# Patient Record
Sex: Female | Born: 1950 | Race: White | Hispanic: No | Marital: Married | Smoking: Former smoker
Health system: Southern US, Community
[De-identification: ages and names within clinical notes are randomized; demographics above are authoritative.]

## PROBLEM LIST (undated history)

## (undated) DIAGNOSIS — F410 Panic disorder [episodic paroxysmal anxiety] without agoraphobia: Secondary | ICD-10-CM

## (undated) DIAGNOSIS — K219 Gastro-esophageal reflux disease without esophagitis: Secondary | ICD-10-CM

## (undated) DIAGNOSIS — E785 Hyperlipidemia, unspecified: Secondary | ICD-10-CM

## (undated) DIAGNOSIS — K200 Eosinophilic esophagitis: Secondary | ICD-10-CM

## (undated) DIAGNOSIS — Z8619 Personal history of other infectious and parasitic diseases: Secondary | ICD-10-CM

## (undated) DIAGNOSIS — R32 Unspecified urinary incontinence: Secondary | ICD-10-CM

## (undated) DIAGNOSIS — R030 Elevated blood-pressure reading, without diagnosis of hypertension: Secondary | ICD-10-CM

## (undated) DIAGNOSIS — M5030 Other cervical disc degeneration, unspecified cervical region: Secondary | ICD-10-CM

## (undated) DIAGNOSIS — M1990 Unspecified osteoarthritis, unspecified site: Secondary | ICD-10-CM

## (undated) DIAGNOSIS — M8580 Other specified disorders of bone density and structure, unspecified site: Secondary | ICD-10-CM

## (undated) DIAGNOSIS — K5790 Diverticulosis of intestine, part unspecified, without perforation or abscess without bleeding: Secondary | ICD-10-CM

## (undated) DIAGNOSIS — K589 Irritable bowel syndrome without diarrhea: Secondary | ICD-10-CM

## (undated) DIAGNOSIS — M5416 Radiculopathy, lumbar region: Secondary | ICD-10-CM

## (undated) DIAGNOSIS — Z87440 Personal history of urinary (tract) infections: Secondary | ICD-10-CM

## (undated) DIAGNOSIS — N819 Female genital prolapse, unspecified: Secondary | ICD-10-CM

## (undated) DIAGNOSIS — N3010 Interstitial cystitis (chronic) without hematuria: Secondary | ICD-10-CM

## (undated) DIAGNOSIS — N200 Calculus of kidney: Secondary | ICD-10-CM

## (undated) HISTORY — DX: Calculus of kidney: N20.0

## (undated) HISTORY — DX: Personal history of other infectious and parasitic diseases: Z86.19

## (undated) HISTORY — PX: BREAST BIOPSY: SHX20

## (undated) HISTORY — DX: Personal history of urinary (tract) infections: Z87.440

## (undated) HISTORY — DX: Hyperlipidemia, unspecified: E78.5

## (undated) HISTORY — DX: Interstitial cystitis (chronic) without hematuria: N30.10

## (undated) HISTORY — DX: Unspecified urinary incontinence: R32

## (undated) HISTORY — DX: Elevated blood-pressure reading, without diagnosis of hypertension: R03.0

## (undated) HISTORY — DX: Irritable bowel syndrome, unspecified: K58.9

## (undated) HISTORY — DX: Gastro-esophageal reflux disease without esophagitis: K21.9

## (undated) HISTORY — DX: Panic disorder (episodic paroxysmal anxiety): F41.0

## (undated) HISTORY — PX: TONSILLECTOMY: SUR1361

## (undated) HISTORY — DX: Female genital prolapse, unspecified: N81.9

## (undated) HISTORY — DX: Radiculopathy, lumbar region: M54.16

## (undated) HISTORY — DX: Diverticulosis of intestine, part unspecified, without perforation or abscess without bleeding: K57.90

## (undated) HISTORY — DX: Other specified disorders of bone density and structure, unspecified site: M85.80

## (undated) HISTORY — DX: Other cervical disc degeneration, unspecified cervical region: M50.30

## (undated) HISTORY — DX: Eosinophilic esophagitis: K20.0

## (undated) HISTORY — DX: Unspecified osteoarthritis, unspecified site: M19.90

---
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
## 1992-04-24 HISTORY — PX: KNEE ARTHROSCOPY: SUR90

## 1992-04-24 HISTORY — PX: ABDOMINAL HYSTERECTOMY: SHX81

## 1994-04-24 HISTORY — PX: BILATERAL OOPHORECTOMY: SHX1221

## 2007-04-25 HISTORY — PX: OTHER SURGICAL HISTORY: SHX169

## 2008-04-24 HISTORY — PX: ANTERIOR AND POSTERIOR VAGINAL REPAIR: SUR5

## 2008-04-24 HISTORY — PX: RECTOCELE REPAIR: SHX761

## 2009-09-22 HISTORY — PX: CARDIOVASCULAR STRESS TEST: SHX262

## 2011-04-25 DIAGNOSIS — M5416 Radiculopathy, lumbar region: Secondary | ICD-10-CM

## 2011-04-25 DIAGNOSIS — K200 Eosinophilic esophagitis: Secondary | ICD-10-CM

## 2011-04-25 HISTORY — PX: ESOPHAGOGASTRODUODENOSCOPY: SHX1529

## 2011-04-25 HISTORY — DX: Eosinophilic esophagitis: K20.0

## 2011-04-25 HISTORY — DX: Radiculopathy, lumbar region: M54.16

## 2011-09-25 LAB — CBC
HEMOGLOBIN: 12.1 g/dL
PLATELET COUNT: 263
WBC: 7.3

## 2012-03-24 HISTORY — PX: COLONOSCOPY: SHX174

## 2012-03-27 LAB — HM COLONOSCOPY

## 2013-01-10 LAB — LIPID PANEL
Cholesterol: 236 mg/dL — AB (ref 0–200)
HDL: 82 mg/dL — AB (ref 35–70)
LDL CALC: 118
Triglycerides: 178

## 2013-01-10 LAB — COMPREHENSIVE METABOLIC PANEL
ALT: 30 U/L (ref 7–35)
AST: 18 U/L
Alkaline Phosphatase: 74 U/L
BILIRUBIN TOTAL: 0.4 mg/dL
CREATININE: 0.47
GLUCOSE: 101
POTASSIUM: 4.5 mmol/L
Sodium: 138 mmol/L (ref 137–147)

## 2013-01-10 LAB — CBC
HGB: 12.6 g/dL
WBC: 6.4
platelet count: 267

## 2013-01-10 LAB — TSH: TSH: 1.24

## 2013-02-12 ENCOUNTER — Emergency Department: Payer: Self-pay | Admitting: Emergency Medicine

## 2013-02-12 ENCOUNTER — Emergency Department: Payer: Self-pay

## 2013-02-12 LAB — COMPREHENSIVE METABOLIC PANEL
Alkaline Phosphatase: 132 U/L (ref 50–136)
Bilirubin,Total: 0.3 mg/dL (ref 0.2–1.0)
Chloride: 104 mmol/L (ref 98–107)
Co2: 26 mmol/L (ref 21–32)
EGFR (African American): >60
EGFR (Non-African Amer.): >60
Potassium: 3.8 mmol/L (ref 3.5–5.1)
SGOT(AST): 39 U/L — ABNORMAL HIGH (ref 15–37)
Sodium: 136 mmol/L (ref 136–145)
Total Protein: 7.4 g/dL (ref 6.4–8.2)

## 2013-02-12 LAB — URINALYSIS, COMPLETE
Bilirubin,UR: NEGATIVE
Hyaline Cast: 2
Ketone: NEGATIVE
Nitrite: NEGATIVE
Protein: NEGATIVE
RBC,UR: 3 /HPF (ref 0–5)
Specific Gravity: 1.003 (ref 1.003–1.030)
Squamous Epithelial: 1
WBC UR: 26 /HPF (ref 0–5)

## 2013-02-12 LAB — APTT: Activated PTT: 30.9 secs (ref 23.6–35.9)

## 2013-02-12 LAB — CBC
HGB: 12.2 g/dL (ref 12.0–16.0)
MCH: 31.2 pg (ref 26.0–34.0)
MCV: 90 fL (ref 80–100)
Platelet: 251 10*3/uL (ref 150–440)
RDW: 13.1 % (ref 11.5–14.5)
WBC: 12 10*3/uL — ABNORMAL HIGH (ref 3.6–11.0)

## 2013-02-12 LAB — PROTIME-INR
INR: 0.9
Prothrombin Time: 12.6 secs (ref 11.5–14.7)

## 2013-02-13 ENCOUNTER — Emergency Department: Payer: Self-pay | Admitting: Emergency Medicine

## 2013-05-14 ENCOUNTER — Encounter: Payer: Self-pay | Admitting: Family Medicine

## 2013-05-14 ENCOUNTER — Ambulatory Visit (INDEPENDENT_AMBULATORY_CARE_PROVIDER_SITE_OTHER): Payer: 59 | Admitting: Family Medicine

## 2013-05-14 VITALS — BP 128/82 | HR 64 | Temp 98.0°F | Ht 63.0 in | Wt 155.8 lb

## 2013-05-14 DIAGNOSIS — M899 Disorder of bone, unspecified: Secondary | ICD-10-CM

## 2013-05-14 DIAGNOSIS — M1990 Unspecified osteoarthritis, unspecified site: Secondary | ICD-10-CM | POA: Insufficient documentation

## 2013-05-14 DIAGNOSIS — M949 Disorder of cartilage, unspecified: Secondary | ICD-10-CM

## 2013-05-14 DIAGNOSIS — M8580 Other specified disorders of bone density and structure, unspecified site: Secondary | ICD-10-CM | POA: Insufficient documentation

## 2013-05-14 DIAGNOSIS — M129 Arthropathy, unspecified: Secondary | ICD-10-CM

## 2013-05-14 DIAGNOSIS — E785 Hyperlipidemia, unspecified: Secondary | ICD-10-CM | POA: Insufficient documentation

## 2013-05-14 DIAGNOSIS — N3010 Interstitial cystitis (chronic) without hematuria: Secondary | ICD-10-CM | POA: Insufficient documentation

## 2013-05-14 DIAGNOSIS — M546 Pain in thoracic spine: Secondary | ICD-10-CM

## 2013-05-14 DIAGNOSIS — F410 Panic disorder [episodic paroxysmal anxiety] without agoraphobia: Secondary | ICD-10-CM | POA: Insufficient documentation

## 2013-05-14 DIAGNOSIS — R32 Unspecified urinary incontinence: Secondary | ICD-10-CM | POA: Insufficient documentation

## 2013-05-14 LAB — POCT URINALYSIS DIPSTICK
Bilirubin, UA: NEGATIVE
Glucose, UA: NEGATIVE
Ketones, UA: NEGATIVE
LEUKOCYTES UA: NEGATIVE
NITRITE UA: NEGATIVE
PROTEIN UA: NEGATIVE
RBC UA: NEGATIVE
Spec Grav, UA: <=1.005
Urobilinogen, UA: 0.2
pH, UA: 6.5

## 2013-05-14 MED ORDER — ALPRAZOLAM 0.5 MG PO TABS: 0.5000 mg | ORAL_TABLET | Freq: Two times a day (BID) | ORAL | Status: DC | PRN

## 2013-05-14 MED ORDER — ESTRADIOL 2 MG PO TABS: 2.0000 mg | ORAL_TABLET | Freq: Every day | ORAL | Status: DC

## 2013-05-14 NOTE — Progress Notes (Signed)
Pre-visit discussion using our clinic review tool. No additional management support is needed unless otherwise documented below in the visit note.  

## 2013-05-14 NOTE — Patient Instructions (Signed)
I wonder about rhomboid strain - start stretching exercises provided today.  Look into massage. Ice/heat whichever soothes back better. Urine checked today. Return in 3-4 months for physical, prior fasting for blood work. Good to see you today, call us with questions.

## 2013-05-14 NOTE — Progress Notes (Signed)
Subjective:    Patient ID: Jacqueline Wolf, female    DOB: 19-Jan-1951, 63 y.o.   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  Panic attacks with anxiety - rare xanax use - requests refill today.  Overall well controlled.  Interstitial cystitis - tends to recur every February.  Noticing cloudy urine and odor over last few weeks.  Denies dysuria.  Did have hematuria 01/2013 - dx with bladder and kidney infection.  Would like urine checked today.  Has established with Dr. Lonna CobbStoioff.  H/o kidney stones as well.  Having some R shoulderblade pain over last 4 months.  Has not improved with chiropractor.  This may have started after lifting heavy client into wheelchair.  Preventative: Last CPE 12/2012 - new insurance however Well woman - s/p hysterectomy and oophorectomy Colon cancer screening - 2014 diverticulosis o/w normal [facility] - 12/2012 Birads 2. Dexa - osteopenia Flu shot 2014 Tetanus - 2012 Shingles - will discuss at CPE  Lives with husband and dog Occupation: Transition CNA Edu: HS Activity: pilates, walking  Medications and allergies reviewed and updated in chart.  Past histories reviewed and updated if relevant as below. Patient Active Problem List   Diagnosis Date Noted  . Panic attacks   . Interstitial cystitis   . Arthritis   . Osteopenia   . HLD (hyperlipidemia)   . Urine incontinence    Past Medical History  Diagnosis Date  . Panic attacks 1980s    rare use of xanax  . Interstitial cystitis     did not side effects of elmicon  . Arthritis   . Osteopenia     cal/vit D daily  . Elevated blood pressure reading without diagnosis of hypertension   . HLD (hyperlipidemia)   . Kidney stones 01/2013?  Marland Kitchen. Urine incontinence   . Hx: UTI (urinary tract infection)   . History of hepatitis B 1968  . Eosinophilic esophagitis 2013    on PPI - beef allergy   Past Surgical History    Procedure Laterality Date  . Breast biopsy Bilateral [phone],1991    all benign  . Tonsillectomy  child  . Abdominal hysterectomy  1994    ovaries remain  . Rectal prolapse repair  2010  . Vagina surgery  2010    mesh  . Knee arthroscopy Right 1994  . Bilateral oophorectomy Bilateral 1996    ovaries removed - cysts  . Esophagogastroduodenoscopy  2013    eosinophilic esophagitis  . Colonoscopy  2014    diveticulosis o/w normal per patient   History  Substance Use Topics  . Smoking status: Former Smoker    Quit date: 02/23/2008  . Smokeless tobacco: Never Used  . Alcohol Use: Yes     Comment: occasional alcohol   Family History  Problem Relation Age of Onset  . Diabetes Maternal Grandmother     and great aunt  . Diabetes Maternal Grandfather   . Cancer Brother 60    liver (EtOH)  . Cancer Mother     bladder  . CAD Maternal Grandfather     MI  . Stroke Neg Hx   . Alcohol abuse Brother     and sisters   Allergies  Allergen Reactions  . Beef-Derived Products Other (See Comments)    Eosinophilic esophagitis  . Codeine Hives  . Donnatal [Pb-Hyoscy-Atropine-Scopolamine] Nausea And Vomiting  . Penicillins Hives   No  current outpatient prescriptions on file prior to visit.   No current facility-administered medications on file prior to visit.     Review of Systems Per HPI    Objective:   Physical Exam  Nursing note and vitals reviewed. Constitutional: She is oriented to person, place, and time. She appears well-developed and well-nourished. No distress.  HENT:  Head: Normocephalic and atraumatic.  Right Ear: External ear normal.  Left Ear: External ear normal.  Nose: Nose normal.  Mouth/Throat: Oropharynx is clear and moist. No oropharyngeal exudate.  Eyes: Conjunctivae and EOM are normal. Pupils are equal, round, and reactive to light. No scleral icterus.  Neck: Normal range of motion. Neck supple. Carotid bruit is not present. No thyromegaly present.   Cardiovascular: Normal rate, regular rhythm, normal heart sounds and intact distal pulses.   No murmur heard. Pulses:      Radial pulses are 2+ on the right side, and 2+ on the left side.  Pulmonary/Chest: Effort normal and breath sounds normal. No respiratory distress. She has no wheezes. She has no rales.  Abdominal: Soft. Normal appearance and bowel sounds are normal. She exhibits no distension and no mass. There is no hepatosplenomegaly. There is no tenderness. There is no rebound, no guarding and no CVA tenderness.  Musculoskeletal: Normal range of motion. She exhibits no edema (tr).  Tender to palpation at R rhomboids No midline spine tenderness  Lymphadenopathy:    She has no cervical adenopathy.  Neurological: She is alert and oriented to person, place, and time.  CN grossly intact, station and gait intact  Skin: Skin is warm and dry. No rash noted.  Psychiatric: She has a normal mood and affect. Her behavior is normal. Judgment and thought content normal.       Assessment & Plan:

## 2013-05-15 ENCOUNTER — Encounter: Payer: Self-pay | Admitting: Family Medicine

## 2013-05-15 DIAGNOSIS — M546 Pain in thoracic spine: Secondary | ICD-10-CM | POA: Insufficient documentation

## 2013-05-15 NOTE — Assessment & Plan Note (Signed)
Will review prior records for latest dexa. Pt currently on calcium/vit D

## 2013-05-15 NOTE — Assessment & Plan Note (Signed)
Anticipate R rhomboid strain given location of pain - provided with stretching exercises today.

## 2013-05-15 NOTE — Assessment & Plan Note (Signed)
Continue alprazolam prn

## 2013-05-15 NOTE — Assessment & Plan Note (Addendum)
UA today negative for infection. Discussed NSAID use.

## 2013-05-15 NOTE — Assessment & Plan Note (Signed)
Off meds - will continue to monitor.

## 2013-05-19 ENCOUNTER — Encounter: Payer: Self-pay | Admitting: Family Medicine

## 2013-05-28 ENCOUNTER — Encounter: Payer: Self-pay | Admitting: *Deleted

## 2013-07-02 ENCOUNTER — Telehealth: Payer: Self-pay | Admitting: Family Medicine

## 2013-07-02 NOTE — Telephone Encounter (Signed)
plz notify I received pt and husband's records from [facility]

## 2013-07-02 NOTE — Telephone Encounter (Signed)
Patient notified

## 2013-07-10 ENCOUNTER — Telehealth: Payer: Self-pay | Admitting: Family Medicine

## 2013-07-10 NOTE — Telephone Encounter (Signed)
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  Best number to call pt is [phone] prior to faxing form to [phone] / lt

## 2013-07-10 NOTE — Telephone Encounter (Signed)
Spoke with patient. She said rhomboid strain is better and she is aware of back health/safety and will use this at work. Letter faxed to patient as requested.

## 2013-07-10 NOTE — Telephone Encounter (Signed)
Letter written and placed in Jacqueline Wolf's box. plz verify with patient her rhomboid strain is better as she may need to work with clients in her job functions and this may entail heavy lifting.

## 2013-09-07 ENCOUNTER — Encounter: Payer: Self-pay | Admitting: Family Medicine

## 2013-09-11 ENCOUNTER — Encounter: Payer: Self-pay | Admitting: Family Medicine

## 2013-09-11 ENCOUNTER — Telehealth: Payer: Self-pay

## 2013-09-11 ENCOUNTER — Ambulatory Visit (INDEPENDENT_AMBULATORY_CARE_PROVIDER_SITE_OTHER): Payer: 59 | Admitting: Family Medicine

## 2013-09-11 VITALS — BP 144/84 | HR 88 | Temp 97.9°F | Wt 153.5 lb

## 2013-09-11 DIAGNOSIS — K200 Eosinophilic esophagitis: Secondary | ICD-10-CM

## 2013-09-11 DIAGNOSIS — M546 Pain in thoracic spine: Secondary | ICD-10-CM

## 2013-09-11 DIAGNOSIS — E8940 Asymptomatic postprocedural ovarian failure: Secondary | ICD-10-CM

## 2013-09-11 DIAGNOSIS — Z79890 Hormone replacement therapy (postmenopausal): Secondary | ICD-10-CM | POA: Insufficient documentation

## 2013-09-11 MED ORDER — METHOCARBAMOL 500 MG PO TABS: 500.0000 mg | ORAL_TABLET | Freq: Three times a day (TID) | ORAL | Status: DC | PRN

## 2013-09-11 NOTE — Progress Notes (Signed)
Pre visit review using our clinic review tool, if applicable. No additional management support is needed unless otherwise documented below in the visit note. 

## 2013-09-11 NOTE — Telephone Encounter (Signed)
Message left advising patient.  

## 2013-09-11 NOTE — Telephone Encounter (Signed)
plz notify robaxin was sent in for pt to try.

## 2013-09-11 NOTE — Assessment & Plan Note (Signed)
Has been on HRT for last 20 yrs.  Discussed slow taper off-  Will start estrogen 1mg  daily.

## 2013-09-11 NOTE — Progress Notes (Signed)
BP 144/84  Pulse 88  Temp(Src) 97.9 F (36.6 C) (Oral)  Wt 153 lb 8 oz (69.627 kg)   CC: 4 mo f/u visit  Subjective:    Patient ID: Jacqueline Wolf, female    DOB: 1951-01-06, 63 y.o.   MRN: [id number]  HPI: Jacqueline MarchKaren Hendley is a 63 y.o. female presenting on 09/11/2013 for Follow-up   I asked her to return for CPE but pt scheduled f/u visit today. Not due for CPE until 12/2012. Pt states this f/u was for shoulder/back ache.  Saw chirpractor, may have worsened (now with R neck pain as well).  Initially thought rhomboid strain and treated with exercises which improved sxs.  Over last few weeks - sxs recurred - after she moved from apt to new house. Did heavy lifting along with packing/unpacking.    Endorses R rhomboid pain along with R neck pain with some radiation down right arm.  Notices pain mainly in am. Denies weakness of arms. Self treats with ibuprofen 400mg  bid prn. Tolerates NSAID well.   BP Readings from Last 3 Encounters:  09/11/13 144/84  05/14/13 128/82  wonders if elevated bp due to pain.  Tends to run low 110 systolic.  Relevant past medical, surgical, family and social history reviewed and updated as indicated.  Allergies and medications reviewed and updated. Current Outpatient Prescriptions on File Prior to Visit  Medication Sig  . ALPRAZolam (XANAX) 0.5 MG tablet Take 1 tablet (0.5 mg total) by mouth 2 (two) times daily as needed for anxiety.  . Calcium Carb-Cholecalciferol (CALCIUM-VITAMIN D3) 600-500 MG-UNIT CAPS Take 2 capsules by mouth daily.  Marland Kitchen. docusate sodium (COLACE) 100 MG capsule Take 100 mg by mouth daily.  . Multiple Vitamin (MULTIVITAMIN) tablet Take 1 tablet by mouth daily.  Marland Kitchen. omeprazole (PRILOSEC) 20 MG capsule Take 20 mg by mouth daily.  . solifenacin (VESICARE) 5 MG tablet Take 5 mg by mouth daily.  . vitamin E 400 UNIT capsule Take 400 Units by mouth daily.   No current facility-administered medications on file prior to visit.    Review of  Systems Per HPI unless specifically indicated above    Objective:    BP 144/84  Pulse 88  Temp(Src) 97.9 F (36.6 C) (Oral)  Wt 153 lb 8 oz (69.627 kg)  Physical Exam  Nursing note and vitals reviewed. Constitutional: She appears well-developed and well-nourished. No distress.  HENT:  Mouth/Throat: Oropharynx is clear and moist. No oropharyngeal exudate.  Neck: Normal range of motion. Neck supple.  Cardiovascular: Normal rate, regular rhythm, normal heart sounds and intact distal pulses.   No murmur heard. Pulmonary/Chest: Effort normal and breath sounds normal. No respiratory distress. She has no wheezes. She has no rales.  Musculoskeletal:  FROM at neck and shoulders without pain. No midline spine tenderness at cervical or thoracic spine. Tender to palpation R lateral neck as well as R rhomboids.   Results for orders placed in visit on 05/28/13  CBC      Result Value Ref Range   WBC 6.4     HGB 12.6     platelet count 267    COMPREHENSIVE METABOLIC PANEL      Result Value Ref Range   Total Bilirubin 0.4     Alkaline Phosphatase 74     AST 18     Glucose 101     Creat 0.47     Sodium 138  137 - 147 mmol/L   Potassium 4.5     ALT 30  7 - 35 U/L  LIPID PANEL      Result Value Ref Range   Triglycerides 178     Cholesterol 236 (*) 0 - 200 mg/dL   HDL 82 (*) 35 - 70 mg/dL   LDL (calc) 161118    TSH      Result Value Ref Range   TSH 1.24        Assessment & Plan:   Problem List Items Addressed This Visit   Thoracic back pain - Primary     Still consistent with rhomboid strain along with cervical neck strain after recent increase in heavy lifting with move. rec continue ibuprofen prn as well as muscle relaxant. rec continue home exercises provided last visit which helped. Update if sxs persist or deteriorate. Consider PT if not improving.    Surgical menopause on hormone replacement therapy     Has been on HRT for last 20 yrs.  Discussed slow taper off-  Will  start estrogen 1mg  daily.    Eosinophilic esophagitis     Continue ppi as she is on nsaids intermittently.        Follow up plan: Return in about 6 months (around 03/14/2014), or as needed, for annual exam, prior fasting for blood work.

## 2013-09-11 NOTE — Telephone Encounter (Signed)
Pt was seen earlier today and was to cb with name of muscle relaxant that was too strong for pt; cyclobenzaprine 10 mg. Pt request different muscle relaxant to [facility].

## 2013-09-11 NOTE — Patient Instructions (Addendum)
Let's decrease estrogen to 1/2 tablet daily (1mg ). I think this back pain is still musculoskeletal. Call me with muscle relaxant you have at home and we will try a different one as this one is causing you too sleepy. Good to see you today, call us with questions. Return 6 months for physical.

## 2013-09-11 NOTE — Assessment & Plan Note (Signed)
Continue ppi as she is on nsaids intermittently.

## 2013-09-11 NOTE — Assessment & Plan Note (Signed)
Still consistent with rhomboid strain along with cervical neck strain after recent increase in heavy lifting with move. rec continue ibuprofen prn as well as muscle relaxant. rec continue home exercises provided last visit which helped. Update if sxs persist or deteriorate. Consider PT if not improving.

## 2013-09-12 ENCOUNTER — Encounter: Payer: Self-pay | Admitting: *Deleted

## 2013-09-26 ENCOUNTER — Other Ambulatory Visit: Payer: Self-pay | Admitting: Family Medicine

## 2013-09-26 NOTE — Telephone Encounter (Signed)
Ok to refill 

## 2013-10-18 ENCOUNTER — Other Ambulatory Visit: Payer: Self-pay | Admitting: Family Medicine

## 2013-12-24 ENCOUNTER — Other Ambulatory Visit: Payer: Self-pay | Admitting: Family Medicine

## 2014-02-05 ENCOUNTER — Other Ambulatory Visit: Payer: Self-pay | Admitting: Family Medicine

## 2014-02-16 ENCOUNTER — Other Ambulatory Visit: Payer: Self-pay | Admitting: Family Medicine

## 2014-02-16 ENCOUNTER — Other Ambulatory Visit (INDEPENDENT_AMBULATORY_CARE_PROVIDER_SITE_OTHER): Payer: 59

## 2014-02-16 DIAGNOSIS — E785 Hyperlipidemia, unspecified: Secondary | ICD-10-CM

## 2014-02-16 LAB — COMPREHENSIVE METABOLIC PANEL
ALT: 23 U/L (ref 0–35)
AST: 25 U/L (ref 0–37)
Albumin: 3.6 g/dL (ref 3.5–5.2)
Alkaline Phosphatase: 51 U/L (ref 39–117)
BILIRUBIN TOTAL: 0.6 mg/dL (ref 0.2–1.2)
BUN: 10 mg/dL (ref 6–23)
CALCIUM: 9.2 mg/dL (ref 8.4–10.5)
CHLORIDE: 103 meq/L (ref 96–112)
CO2: 26 mEq/L (ref 19–32)
CREATININE: 0.6 mg/dL (ref 0.4–1.2)
GFR: 99.56 mL/min (ref >60.00)
GLUCOSE: 97 mg/dL (ref 70–99)
Potassium: 4.5 mEq/L (ref 3.5–5.1)
SODIUM: 139 meq/L (ref 135–145)
TOTAL PROTEIN: 7.2 g/dL (ref 6.0–8.3)

## 2014-02-16 LAB — TSH: TSH: 1.5 u[IU]/mL (ref 0.35–4.50)

## 2014-02-16 LAB — LIPID PANEL
Cholesterol: 255 mg/dL — ABNORMAL HIGH (ref 0–200)
HDL: 71.3 mg/dL (ref >39.00)
NONHDL: 183.7
TRIGLYCERIDES: 234 mg/dL — AB (ref 0.0–149.0)
Total CHOL/HDL Ratio: 4
VLDL: 46.8 mg/dL — ABNORMAL HIGH (ref 0.0–40.0)

## 2014-02-16 LAB — LDL CHOLESTEROL, DIRECT: LDL DIRECT: 149 mg/dL

## 2014-02-23 ENCOUNTER — Ambulatory Visit (INDEPENDENT_AMBULATORY_CARE_PROVIDER_SITE_OTHER): Payer: 59 | Admitting: Family Medicine

## 2014-02-23 ENCOUNTER — Ambulatory Visit (INDEPENDENT_AMBULATORY_CARE_PROVIDER_SITE_OTHER)
Admission: RE | Admit: 2014-02-23 | Discharge: 2014-02-23 | Disposition: A | Discharge status: Home / Self Care | Payer: 59 | Source: Ambulatory Visit | Attending: Family Medicine | Admitting: Family Medicine

## 2014-02-23 ENCOUNTER — Encounter: Payer: Self-pay | Admitting: Family Medicine

## 2014-02-23 VITALS — BP 124/82 | HR 76 | Temp 97.7°F | Ht 63.0 in | Wt 156.2 lb

## 2014-02-23 DIAGNOSIS — Z0000 Encounter for general adult medical examination without abnormal findings: Secondary | ICD-10-CM

## 2014-02-23 DIAGNOSIS — Z23 Encounter for immunization: Secondary | ICD-10-CM

## 2014-02-23 DIAGNOSIS — Z79890 Hormone replacement therapy (postmenopausal): Secondary | ICD-10-CM

## 2014-02-23 DIAGNOSIS — E8940 Asymptomatic postprocedural ovarian failure: Secondary | ICD-10-CM

## 2014-02-23 DIAGNOSIS — M546 Pain in thoracic spine: Secondary | ICD-10-CM

## 2014-02-23 DIAGNOSIS — M8580 Other specified disorders of bone density and structure, unspecified site: Secondary | ICD-10-CM

## 2014-02-23 DIAGNOSIS — F410 Panic disorder [episodic paroxysmal anxiety] without agoraphobia: Secondary | ICD-10-CM

## 2014-02-23 DIAGNOSIS — Z1239 Encounter for other screening for malignant neoplasm of breast: Secondary | ICD-10-CM

## 2014-02-23 DIAGNOSIS — E785 Hyperlipidemia, unspecified: Secondary | ICD-10-CM

## 2014-02-23 MED ORDER — ALPRAZOLAM 0.5 MG PO TABS
0.5000 mg | ORAL_TABLET | Freq: Two times a day (BID) | ORAL | Status: DC | PRN
Start: 2014-02-23 — End: 2014-07-22

## 2014-02-23 NOTE — Addendum Note (Signed)
Addended by: Josph MachoANCE, Jahlani Lorentz A on: 02/23/2014 10:22 AM   Modules accepted: Orders

## 2014-02-23 NOTE — Patient Instructions (Addendum)
Pass by Marion's office to schedule mammogram. zostavax today. Cholesterol was too high - work on low cholesterol diet, handout provided today. Good to see you today, call us with questions. Return as needed or in 6 months to recheck cholesterol levels (prior for fasting labs). For back - xray today and we will refer you to PT.

## 2014-02-23 NOTE — Assessment & Plan Note (Signed)
Refill xanax - rare use. Last filled 04/2013.

## 2014-02-23 NOTE — Assessment & Plan Note (Signed)
Previously and again today consistent with rhomboid strain. However given duration of pain, will obtain thoracic spine films and refer to outpatient PT. If not improved, would consider MRI for further evaluation.

## 2014-02-23 NOTE — Assessment & Plan Note (Signed)
Preventative protocols reviewed and updated unless pt declined. Discussed healthy diet and lifestyle.  

## 2014-02-23 NOTE — Addendum Note (Signed)
Addended by: Josph MachoANCE, Hensley Treat A on: 02/23/2014 10:06 AM   Modules accepted: Orders

## 2014-02-23 NOTE — Progress Notes (Signed)
BP 124/82 mmHg  Pulse 76  Temp(Src) 97.7 F (36.5 C) (Tympanic)  Ht 5\' 3"  (1.6 m)  Wt 156 lb 4 oz (70.875 kg)  BMI 27.69 kg/m2   CC: CPE  Subjective:    Patient ID: Jacqueline Wolf, female    DOB: 1950-09-23, 63 y.o.   MRN: [id number]  HPI: Jacqueline Wolf is a 63 y.o. female presenting on 02/23/2014 for Annual Exam   Tried to back off estrogen - did not tolerate - worsening mood Continued thoracic back pain. Ongoing pain for the last year. Previously thought rhomboid strain related.  Occasional LLQ pain ongoing for last few weeks. No fevers, but occasional BM Changes. S/p normal CT scan and cystoscopy and colonoscopy. Known diverticulosis and IBS, previously librax helped but caused pruritis.  Has f/u appt with Stoioff nov 2015. Rare xanax use - requests refill today. Last filled 04/2013.  Preventative: Well woman - s/p hysterectomy and oophorectomy Colon cancer screening - 2014 diverticulosis o/w normal per patient [facility] mammo - 12/2012 Birads 2. 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  Allergies and medications reviewed and updated. Current Outpatient Prescriptions on File Prior to Visit  Medication Sig  . ALPRAZolam (XANAX) 0.5 MG tablet Take 1 tablet (0.5 mg total) by mouth 2 (two) times daily as needed for anxiety.  . Calcium Carb-Cholecalciferol (CALCIUM-VITAMIN D3) 600-500 MG-UNIT CAPS Take 2 capsules by mouth daily.  Marland Kitchen docusate sodium (COLACE) 100 MG capsule Take 100 mg by mouth daily.  Marland Kitchen estradiol (ESTRACE) 2 MG tablet Take 2 mg by mouth daily.   . methocarbamol (ROBAXIN) 500 MG tablet TAKE 1 TABLET BY MOUTH EVERY 8 HOURS AS NEEDED FOR MUSCLE SPASMS  . Multiple Vitamin (MULTIVITAMIN) tablet Take 1 tablet  by mouth daily.  Marland Kitchen omeprazole (PRILOSEC) 20 MG capsule TAKE 1 CAPSULE BY MOUTH ONCE DAILY.  Marland Kitchen solifenacin (VESICARE) 5 MG tablet Take 5 mg by mouth daily.  . vitamin E 400 UNIT capsule Take 400 Units by mouth daily.   No current facility-administered medications on file prior to visit.    Review of Systems  Constitutional: Negative for fever, chills, activity change, appetite change, fatigue and unexpected weight change.  HENT: Negative for hearing loss.   Eyes: Negative for visual disturbance.  Respiratory: Negative for cough, chest tightness, shortness of breath and wheezing.   Cardiovascular: Negative for chest pain, palpitations and leg swelling.  Gastrointestinal: Positive for abdominal pain (see hpi). Negative for nausea, vomiting, diarrhea, constipation, blood in stool and abdominal distention.  Genitourinary: Negative for hematuria and difficulty urinating.  Musculoskeletal: Positive for back pain (thoracic). Negative for myalgias, arthralgias and neck pain.  Skin: Negative for rash.  Neurological: Negative for dizziness, seizures, syncope and headaches.  Hematological: Negative for adenopathy. Does not bruise/bleed easily.  Psychiatric/Behavioral: Negative for dysphoric mood. The patient is not nervous/anxious.    Per HPI unless specifically indicated above    Objective:    BP 124/82 mmHg  Pulse 76  Temp(Src) 97.7 F (36.5 C) (Tympanic)  Ht 5\' 3"  (1.6 m)  Wt 156 lb 4 oz (70.875 kg)  BMI 27.69 kg/m2  Physical Exam  Constitutional: She is oriented to person, place, and time. She appears well-developed and well-nourished. No distress.  HENT:  Head:  Normocephalic and atraumatic.  Right Ear: Hearing, tympanic membrane, external ear and ear canal normal.  Left Ear: Hearing, tympanic membrane, external ear and ear canal normal.  Nose: Nose normal.  Mouth/Throat: Uvula is midline, oropharynx is clear and moist and mucous membranes are normal. No oropharyngeal exudate,  posterior oropharyngeal edema or posterior oropharyngeal erythema.  Eyes: Conjunctivae and EOM are normal. Pupils are equal, round, and reactive to light. No scleral icterus.  Neck: Normal range of motion. Neck supple. No thyromegaly present.  Cardiovascular: Normal rate, regular rhythm, normal heart sounds and intact distal pulses.   No murmur heard. Pulses:      Radial pulses are 2+ on the right side, and 2+ on the left side.  Pulmonary/Chest: Effort normal and breath sounds normal. No respiratory distress. She has no wheezes. She has no rales. Right breast exhibits no inverted nipple, no mass, no nipple discharge, no skin change and no tenderness. Left breast exhibits no inverted nipple, no mass, no nipple discharge, no skin change and no tenderness. Breasts are symmetrical.  Abdominal: Soft. Normal appearance and bowel sounds are normal. She exhibits no distension and no mass. There is no hepatosplenomegaly. There is no tenderness. There is no rigidity, no rebound, no guarding, no CVA tenderness and negative Murphy's sign.  No pain today.  Musculoskeletal: Normal range of motion. She exhibits no edema.  Tender to palpation R rhomboid muscle group  Lymphadenopathy:       Head (right side): No submental, no submandibular, no tonsillar, no preauricular and no posterior auricular adenopathy present.       Head (left side): No submental, no submandibular, no tonsillar, no preauricular and no posterior auricular adenopathy present.    She has no cervical adenopathy.    She has no axillary adenopathy.       Right axillary: No lateral adenopathy present.       Left axillary: No lateral adenopathy present.      Right: No supraclavicular adenopathy present.       Left: No supraclavicular adenopathy present.  Neurological: She is alert and oriented to person, place, and time.  CN grossly intact, station and gait intact  Skin: Skin is warm and dry. No rash noted.  Psychiatric: She has a normal mood  and affect. Her behavior is normal. Judgment and thought content normal.  Nursing note and vitals reviewed.  Results for orders placed or performed in visit on 02/16/14  TSH  Result Value Ref Range   TSH 1.50 0.35 - 4.50 uIU/mL  Lipid panel  Result Value Ref Range   Cholesterol 255 (H) 0 - 200 mg/dL   Triglycerides 045.4234.0 (H) 0.0 - 149.0 mg/dL   HDL 09.8171.30 >19.14>39.00 mg/dL   VLDL 78.246.8 (H) 0.0 - 95.640.0 mg/dL   Total CHOL/HDL Ratio 4    NonHDL 183.70   Comprehensive metabolic panel  Result Value Ref Range   Sodium 139 135 - 145 mEq/L   Potassium 4.5 3.5 - 5.1 mEq/L   Chloride 103 96 - 112 mEq/L   CO2 26 19 - 32 mEq/L   Glucose, Bld 97 70 - 99 mg/dL   BUN 10 6 - 23 mg/dL   Creatinine, Ser 0.6 0.4 - 1.2 mg/dL   Total Bilirubin 0.6 0.2 - 1.2 mg/dL   Alkaline Phosphatase 51 39 - 117 U/L   AST 25 0 - 37 U/L   ALT 23 0 - 35 U/L   Total Protein 7.2 6.0 - 8.3 g/dL   Albumin 3.6 3.5 -  5.2 g/dL   Calcium 9.2 8.4 - 16.110.5 mg/dL   GFR 09.6099.56 >45.40>60.00 mL/min  LDL cholesterol, direct  Result Value Ref Range   Direct LDL 149.0 mg/dL      Assessment & Plan:   Problem List Items Addressed This Visit    Thoracic back pain    Previously and again today consistent with rhomboid strain. However given duration of pain, will obtain thoracic spine films and refer to outpatient PT. If not improved, would consider MRI for further evaluation.    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    Panic attacks    Refill xanax - rare use. Last filled 04/2013.    Osteopenia    Continue cal/vit D    HLD (hyperlipidemia)    Reviewed #s with patient.    Health care maintenance - Primary    Preventative protocols reviewed and updated unless pt declined. Discussed healthy diet and lifestyle.     Other Visit Diagnoses    Breast cancer screening        Relevant Orders        MM DIGITAL SCREENING BILATERAL        Follow up plan: Return in about 6 months (around 08/24/2014), or as needed, for follow up visit.

## 2014-02-23 NOTE — Assessment & Plan Note (Signed)
Did not tolerate taper off HRT.

## 2014-02-23 NOTE — Progress Notes (Signed)
Pre visit review using our clinic review tool, if applicable. No additional management support is needed unless otherwise documented below in the visit note. 

## 2014-02-23 NOTE — Assessment & Plan Note (Signed)
Continue cal/vit D.  

## 2014-02-23 NOTE — Assessment & Plan Note (Signed)
Reviewed #s with patient. 

## 2014-02-24 ENCOUNTER — Telehealth: Payer: Self-pay

## 2014-02-24 NOTE — Telephone Encounter (Signed)
See results note. 

## 2014-02-24 NOTE — Telephone Encounter (Signed)
Pt left v/m; pt anxious to get xray results done on 02/23/14.Please advise.

## 2014-03-04 ENCOUNTER — Other Ambulatory Visit: Payer: Self-pay | Admitting: Family Medicine

## 2014-03-04 NOTE — Telephone Encounter (Signed)
Ok to refill 

## 2014-03-26 ENCOUNTER — Ambulatory Visit: Payer: Self-pay | Admitting: Family Medicine

## 2014-03-26 ENCOUNTER — Encounter: Payer: Self-pay | Admitting: Family Medicine

## 2014-03-26 ENCOUNTER — Encounter: Payer: Self-pay | Admitting: *Deleted

## 2014-03-26 LAB — HM MAMMOGRAPHY: HM Mammogram: NORMAL

## 2014-04-01 ENCOUNTER — Encounter: Payer: Self-pay | Admitting: Family Medicine

## 2014-04-01 ENCOUNTER — Ambulatory Visit (INDEPENDENT_AMBULATORY_CARE_PROVIDER_SITE_OTHER): Payer: 59 | Admitting: Family Medicine

## 2014-04-01 VITALS — BP 128/74 | HR 96 | Temp 98.0°F | Wt 152.5 lb

## 2014-04-01 DIAGNOSIS — M546 Pain in thoracic spine: Secondary | ICD-10-CM

## 2014-04-01 MED ORDER — PREDNISONE 20 MG PO TABS: ORAL_TABLET | ORAL | Status: DC

## 2014-04-01 NOTE — Addendum Note (Signed)
Addended by: Eustaquio BoydenGUTIERREZ, Jay Haskew on: 04/01/2014 01:33 PM   Modules accepted: Level of Service

## 2014-04-01 NOTE — Assessment & Plan Note (Signed)
Chronic longstanding R thoracic back pain (1.5 years) affecting daily routine and limiting ability to increase hours at work.  Failed PT, OTC NSAIDs, 2 muscle relaxants. Pt hesitant for prescription NSAID or stronger pain med 2/2 side effects. Will obtain thoracic MRI to eval for thoracic radiculopathy or herniated disc or other cause of chronic pain. Will treat anticipated inflammation with prednisone taper - discussed steroid precautions with patient. Will also refer to Dr Venetia MaxonStern per pt request for further evaluation of pain. Update if any worsening. Pt agrees with plan.

## 2014-04-01 NOTE — Progress Notes (Signed)
Pre visit review using our clinic review tool, if applicable. No additional management support is needed unless otherwise documented below in the visit note. 

## 2014-04-01 NOTE — Patient Instructions (Signed)
Prednisone course. Pass by Marion's office for MRI and referral to Dr Venetia MaxonStern.

## 2014-04-01 NOTE — Progress Notes (Signed)
BP 128/74 mmHg  Pulse 96  Temp(Src) 98 F (36.7 C) (Oral)  Wt 152 lb 8 oz (69.174 kg)   CC: f/u back pain  Subjective:    Patient ID: Jacqueline Wolf, female    DOB: 05/04/1950, 63 y.o.   MRN: [id number]  HPI: Jacqueline Wolf is a 63 y.o. female presenting on 04/01/2014 for Follow-up   See prior note for details - briefly, pt has suffered from longstanding (1.5 yrs) right sided thoracic back pain previously thought rhomboid strain-related. Some radiation to posterior upper arm/shoulder and neck on right. Denies numbness or weakness. Treated with physical therapy course - with no noted improvement and actually caused R upper arm and neck strain. Frustrated with continued pain. 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   Flexeril too sedating. Robaxin initially helpful but now lost effectiveness. OTC NSAIDs no help. Hesitant for prescription NSAIDs 2/2 GI side effects. Has not taken steroids in the past.  Considering acupuncture.   THORACIC SPINE - 2 VIEW + SWIMMERS COMPARISON: None. FINDINGS: Subtle curvature of the upper thoracic spine convex to the left which may be partly positional in nature. Vertebral body heights and disc spaces are within normal. There is minimal spondylosis of the thoracic spine as well as the visualized cervical spine. Pedicles are intact. There is no compression fracture or subluxation.  IMPRESSION: No acute findings. Mild degenerative changes.  Electronically Signed  By: Elberta Fortisaniel Boyle M.D.  On: 02/23/2014 11:03  Relevant past medical, surgical, family and social history reviewed and updated as indicated. Interim medical history since our last visit reviewed. Allergies and medications reviewed and updated.  Current Outpatient Prescriptions on File Prior to Visit  Medication Sig  . ALPRAZolam (XANAX) 0.5 MG tablet Take 1 tablet (0.5 mg total) by mouth 2 (two) times daily as needed for anxiety.  . Calcium Carb-Cholecalciferol  (CALCIUM-VITAMIN D3) 600-500 MG-UNIT CAPS Take 2 capsules by mouth daily.  Marland Kitchen. docusate sodium (COLACE) 100 MG capsule Take 100 mg by mouth daily.  Marland Kitchen. estradiol (ESTRACE) 2 MG tablet Take 2 mg by mouth daily.   . methocarbamol (ROBAXIN) 500 MG tablet TAKE 1 TABLET BY MOUTH EVERY 8 HOURS AS NEEDED FOR MUSCLE SPASMS  . Multiple Vitamin (MULTIVITAMIN) tablet Take 1 tablet by mouth daily.  Marland Kitchen. omeprazole (PRILOSEC) 20 MG capsule TAKE 1 CAPSULE BY MOUTH ONCE DAILY.  Marland Kitchen. solifenacin (VESICARE) 5 MG tablet Take 5 mg by mouth daily.  . vitamin E 400 UNIT capsule Take 400 Units by mouth daily.   No current facility-administered medications on file prior to visit.   Past Medical History  Diagnosis Date  . Panic attacks 1980s    rare use of xanax  . Interstitial cystitis     did not tolerate side effects of elmicon (urology)  . Arthritis   . Osteopenia     cal/vit D daily  . Elevated blood pressure reading without diagnosis of hypertension   . HLD (hyperlipidemia)   . Kidney stones 01/2013?  Marland Kitchen. Urine incontinence   . Hx: UTI (urinary tract infection)   . History of hepatitis B 1968  . Eosinophilic esophagitis 2013    on PPI - beef allergy  . Prolapse of female pelvic organs     with rectocele and vaginal atrophy, has seen urogyn in past  . IBS (irritable bowel syndrome)   . Esophageal reflux   . DDD (degenerative disc disease), cervical 2010    C5/6 by MRI  . Lumbar radiculopathy 2013  R L4/5 by MRI  . Diverticulosis     by CT and colonoscopy    Past Surgical History  Procedure Laterality Date  . Breast biopsy Bilateral [phone],1981,1991    all benign  . Tonsillectomy  child  . Abdominal hysterectomy  1994    ovaries removed  . Rectocele repair  2010  . Anterior and posterior vaginal repair  2010    with cystocele repair, mesh  . Knee arthroscopy Right 1994  . Bilateral oophorectomy Bilateral 1996    ovaries removed - cysts  . Esophagogastroduodenoscopy  2013    eosinophilic  esophagitis  . Colonoscopy  03/2012    diveticulosis o/w normal per patient  . Cardiovascular stress test  09/2009    WNL EF 65%  . Dexa  2009    WNL, mild osteopenia T -1.0   History  Substance Use Topics  . Smoking status: Former Smoker    Quit date: 02/23/2008  . Smokeless tobacco: Never Used  . Alcohol Use: Yes     Comment: occasional alcohol   Review of Systems Per HPI unless specifically indicated above     Objective:    BP 128/74 mmHg  Pulse 96  Temp(Src) 98 F (36.7 C) (Oral)  Wt 152 lb 8 oz (69.174 kg)  Wt Readings from Last 3 Encounters:  04/01/14 152 lb 8 oz (69.174 kg)  02/23/14 156 lb 4 oz (70.875 kg)  09/11/13 153 lb 8 oz (69.627 kg)    Physical Exam  Constitutional: She is oriented to person, place, and time. She appears well-developed and well-nourished. No distress.  Musculoskeletal: She exhibits no edema.  No midline cervical or thoracic spine pain Tender to palpation right rhomboid area, larger area of local tenderness than prior Some tightness of R rhomboid and R trap mm FROM at neck and shoulders  Neurological: She is alert and oriented to person, place, and time.  Sensation and strength grossly intact  Nursing note and vitals reviewed.      Assessment & Plan:   Problem List Items Addressed This Visit    Thoracic back pain - Primary    Chronic longstanding R thoracic back pain (1.5 years) affecting daily routine and limiting ability to increase hours at work.  Failed PT, OTC NSAIDs, 2 muscle relaxants. Pt hesitant for prescription NSAID or stronger pain med 2/2 side effects. Will obtain thoracic MRI to eval for thoracic radiculopathy or herniated disc or other cause of chronic pain. Will treat anticipated inflammation with prednisone taper - discussed steroid precautions with patient. Will also refer to Dr Venetia MaxonStern per pt request for further evaluation of pain. Update if any worsening. Pt agrees with plan.    Relevant Medications       predniSONE (DELTASONE) tablet   Other Relevant Orders      MR Thoracic Spine Wo Contrast      Ambulatory referral to Neurosurgery       Follow up plan: Return if symptoms worsen or fail to improve.

## 2014-04-07 ENCOUNTER — Ambulatory Visit
Admission: RE | Admit: 2014-04-07 | Discharge: 2014-04-07 | Disposition: A | Discharge status: Home / Self Care | Payer: 59 | Source: Ambulatory Visit | Attending: Family Medicine | Admitting: Family Medicine

## 2014-04-07 DIAGNOSIS — M546 Pain in thoracic spine: Secondary | ICD-10-CM

## 2014-04-13 ENCOUNTER — Other Ambulatory Visit: Payer: Self-pay | Admitting: Family Medicine

## 2014-06-01 ENCOUNTER — Other Ambulatory Visit: Payer: Self-pay | Admitting: Family Medicine

## 2014-06-01 NOTE — Telephone Encounter (Signed)
Ok to refill 

## 2014-07-06 ENCOUNTER — Encounter: Payer: Self-pay | Admitting: Family Medicine

## 2014-07-06 ENCOUNTER — Ambulatory Visit (INDEPENDENT_AMBULATORY_CARE_PROVIDER_SITE_OTHER): Payer: 59 | Admitting: Family Medicine

## 2014-07-06 VITALS — BP 154/98 | HR 92 | Temp 98.0°F | Wt 156.5 lb

## 2014-07-06 DIAGNOSIS — K200 Eosinophilic esophagitis: Secondary | ICD-10-CM

## 2014-07-06 DIAGNOSIS — M546 Pain in thoracic spine: Secondary | ICD-10-CM

## 2014-07-06 DIAGNOSIS — R030 Elevated blood-pressure reading, without diagnosis of hypertension: Secondary | ICD-10-CM | POA: Insufficient documentation

## 2014-07-06 DIAGNOSIS — R1084 Generalized abdominal pain: Secondary | ICD-10-CM | POA: Insufficient documentation

## 2014-07-06 LAB — POCT URINALYSIS DIPSTICK
Bilirubin, UA: NEGATIVE
Glucose, UA: NEGATIVE
Ketones, UA: NEGATIVE
LEUKOCYTES UA: NEGATIVE
NITRITE UA: NEGATIVE
Protein, UA: NEGATIVE
RBC UA: NEGATIVE
Spec Grav, UA: >=1.03
Urobilinogen, UA: 0.2
pH, UA: 6

## 2014-07-06 MED ORDER — OMEPRAZOLE 40 MG PO CPDR: 40.0000 mg | DELAYED_RELEASE_CAPSULE | Freq: Every day | ORAL | Status: DC

## 2014-07-06 NOTE — Progress Notes (Addendum)
BP 180/100 mmHg  Pulse 100  Temp(Src) 98 F (36.7 C) (Oral)  Wt 156 lb 8 oz (70.988 kg)   CC: abd pain, back pain  Subjective:    Patient ID: Jacqueline Wolf, female    DOB: Jun 23, 1950, 64 y.o.   MRN: [id number]  HPI: Jacqueline Wolf is a 64 y.o. female presenting on 07/06/2014 for Abdominal Pain and Back Pain   Not feeling well today. Started with RUQ pain, indigestion with diarrhea all last week. Some chills and constipation as well.   Denies fevers, vomiting, nausea. Patient with IBS, esophageal reflux with eosinophilic esophagitis last EGD [id number]. Reports compliance with omeprazole. Previous librax caused skin crawling sensation.  Persistent thoracic back pain that hasn't improved. Has seen Dr Venetia MaxonStern who obtained neck MRI and rec referral back to PT.  Methocarbamol 500mg  not helpful.  Stopped working since beginning of this year. Has applied for social security. Doesn't feel she is able to work with current back pain.  Husband is to lose his job this year. Stressed with this.  Son about to get our of jail - imprisoned over 23 yrs.   Relevant past medical, surgical, family and social history reviewed and updated as indicated. Interim medical history since our last visit reviewed. Allergies and medications reviewed and updated. Current Outpatient Prescriptions on File Prior to Visit  Medication Sig  . ALPRAZolam (XANAX) 0.5 MG tablet Take 1 tablet (0.5 mg total) by mouth 2 (two) times daily as needed for anxiety.  . Calcium Carb-Cholecalciferol (CALCIUM-VITAMIN D3) 600-500 MG-UNIT CAPS Take 2 capsules by mouth daily.  Marland Kitchen. docusate sodium (COLACE) 100 MG capsule Take 100 mg by mouth daily.  Marland Kitchen. estradiol (ESTRACE) 2 MG tablet Take 2 mg by mouth daily.   . methocarbamol (ROBAXIN) 500 MG tablet TAKE 1 TABLET BY MOUTH EVERY 8 HOURS AS NEEDED FOR MUSCLE SPASMS  . Multiple Vitamin (MULTIVITAMIN) tablet Take 1 tablet by mouth daily.  . solifenacin (VESICARE) 5 MG tablet Take 5 mg by mouth daily.   . vitamin E 400 UNIT capsule Take 400 Units by mouth daily.   No current facility-administered medications on file prior to visit.   Past Medical History  Diagnosis Date  . Panic attacks 1980s    rare use of xanax  . Interstitial cystitis     did not tolerate side effects of elmicon (urology)  . Arthritis   . Osteopenia     cal/vit D daily  . Elevated blood pressure reading without diagnosis of hypertension   . HLD (hyperlipidemia)   . Kidney stones 01/2013?  Marland Kitchen. Urine incontinence   . Hx: UTI (urinary tract infection)   . History of hepatitis B 1968  . Eosinophilic esophagitis 2013    on PPI - beef allergy  . Prolapse of female pelvic organs     with rectocele and vaginal atrophy, has seen urogyn in past  . IBS (irritable bowel syndrome)   . Esophageal reflux   . DDD (degenerative disc disease), cervical 2010    C5/6 by MRI  . Lumbar radiculopathy 2013    R L4/5 by MRI  . Diverticulosis     by CT and colonoscopy   Past Surgical History  Procedure Laterality Date  . Breast biopsy Bilateral [phone],1981,1991    all benign  . Tonsillectomy  child  . Abdominal hysterectomy  1994    ovaries removed  . Rectocele repair  2010  . Anterior and posterior vaginal repair  2010    with cystocele repair,  mesh  . Knee arthroscopy Right 1994  . Bilateral oophorectomy Bilateral 1996    ovaries removed - cysts  . Esophagogastroduodenoscopy  2013    eosinophilic esophagitis  . Colonoscopy  03/2012    diveticulosis o/w normal per patient  . Cardiovascular stress test  09/2009    WNL EF 65%  . Dexa  2009    WNL, mild osteopenia T -1.0   Review of Systems Per HPI unless specifically indicated above     Objective:    BP 180/100 mmHg  Pulse 100  Temp(Src) 98 F (36.7 C) (Oral)  Wt 156 lb 8 oz (70.988 kg)  Wt Readings from Last 3 Encounters:  07/06/14 156 lb 8 oz (70.988 kg)  04/01/14 152 lb 8 oz (69.174 kg)  02/23/14 156 lb 4 oz (70.875 kg)    Physical Exam    Constitutional: She appears well-developed and well-nourished. No distress.  HENT:  Mouth/Throat: Oropharynx is clear and moist. No oropharyngeal exudate.  Cardiovascular: Normal rate, regular rhythm, normal heart sounds and intact distal pulses.   No murmur heard. Pulmonary/Chest: Effort normal and breath sounds normal. No respiratory distress. She has no wheezes. She has no rales.  Abdominal: Soft. Normal appearance and bowel sounds are normal. She exhibits no distension and no mass. There is no hepatosplenomegaly. There is tenderness (mild) in the epigastric area. There is no rigidity, no rebound, no guarding, no CVA tenderness and negative Murphy's sign.  Musculoskeletal: She exhibits no edema.  Remains tender to palpation R thoracic spine around rhomboids  Skin: Skin is warm and dry. No rash noted.  Psychiatric: Her mood appears not anxious.  Very anxious and teraful today.  Nursing note and vitals reviewed.  Results for orders placed or performed in visit on 07/06/14  Urinalysis Dipstick  Result Value Ref Range   Color, UA Yellow    Clarity, UA Clear    Glucose, UA Negative    Bilirubin, UA Negative    Ketones, UA Negative    Spec Grav, UA >=1.030    Blood, UA Negative    pH, UA 6.0    Protein, UA Negative    Urobilinogen, UA 0.2    Nitrite, UA Negative    Leukocytes, UA Negative       Assessment & Plan:   Problem List Items Addressed This Visit    Thoracic back pain    Will request records from Dr Venetia Maxon to review plan. Pt frustrated she was advised to return to PT after initially told he could help her.      Generalized abdominal pain - Primary    Predominantly epigastric abd pain. ?EE Flare - increase omeprazole to  daily. Known beef intolerance. Check CMP, CBC , lipase today as well as abd [place]. Pt denies nausea. ?IBS flare. Await labs and [place] and then will discuss plan with patient. UA today normal, concentrated.      Relevant Orders   Urinalysis Dipstick  (Completed)   Comprehensive metabolic panel   CBC with Differential/Platelet   Lipase   US Abdomen Complete   Eosinophilic esophagitis    rec increase PPI to omeprazole  daily.      Elevated blood pressure reading without diagnosis of hypertension    Markedly elevated - anticipate from thoracic pain + stress/anxiety today. On recheck 154/82. Advised monitor bp at home and notify us if persistently >140/90 for further treatment. Pt agrees with plan.          Follow up plan: Return if  symptoms worsen or fail to improve.

## 2014-07-06 NOTE — Assessment & Plan Note (Signed)
Will request records from Dr Venetia MaxonStern to review plan. Pt frustrated she was advised to return to PT after initially told he could help her.

## 2014-07-06 NOTE — Assessment & Plan Note (Signed)
rec increase PPI to omeprazole 40mg  daily.

## 2014-07-06 NOTE — Patient Instructions (Addendum)
Start checking blood pressure at home. Let me know if persistently elevated. Blood work today and abdominal ultrasound ordered today - pass by Marion's office to schedule this. I will get records from Dr Venetia MaxonStern.  If not imporving with treatment, let us know.

## 2014-07-06 NOTE — Assessment & Plan Note (Signed)
Markedly elevated - anticipate from thoracic pain + stress/anxiety today. On recheck 154/82. Advised monitor bp at home and notify us if persistently >140/90 for further treatment. Pt agrees with plan.

## 2014-07-06 NOTE — Assessment & Plan Note (Signed)
Predominantly epigastric abd pain. ?EE Flare - increase omeprazole to 40mg  daily. Known beef intolerance. Check CMP, CBC , lipase today as well as abd US. Pt denies nausea. ?IBS flare. Await labs and US and then will discuss plan with patient. UA today normal, concentrated.

## 2014-07-06 NOTE — Progress Notes (Signed)
Pre visit review using our clinic review tool, if applicable. No additional management support is needed unless otherwise documented below in the visit note. 

## 2014-07-07 LAB — COMPREHENSIVE METABOLIC PANEL
ALBUMIN: 4.6 g/dL (ref 3.5–5.2)
ALT: 31 U/L (ref 0–35)
AST: 26 U/L (ref 0–37)
Alkaline Phosphatase: 55 U/L (ref 39–117)
BUN: 7 mg/dL (ref 6–23)
CHLORIDE: 103 meq/L (ref 96–112)
CO2: 29 mEq/L (ref 19–32)
Calcium: 9.9 mg/dL (ref 8.4–10.5)
Creatinine, Ser: 0.63 mg/dL (ref 0.40–1.20)
GFR: 101.26 mL/min (ref >60.00)
Glucose, Bld: 102 mg/dL — ABNORMAL HIGH (ref 70–99)
POTASSIUM: 3.8 meq/L (ref 3.5–5.1)
SODIUM: 138 meq/L (ref 135–145)
Total Bilirubin: 0.3 mg/dL (ref 0.2–1.2)
Total Protein: 7.2 g/dL (ref 6.0–8.3)

## 2014-07-07 LAB — CBC WITH DIFFERENTIAL/PLATELET
Basophils Absolute: 0 10*3/uL (ref 0.0–0.1)
Basophils Relative: 0.4 % (ref 0.0–3.0)
Eosinophils Absolute: 0.1 10*3/uL (ref 0.0–0.7)
Eosinophils Relative: 1.6 % (ref 0.0–5.0)
HCT: 37.5 % (ref 36.0–46.0)
Hemoglobin: 12.6 g/dL (ref 12.0–15.0)
Lymphocytes Relative: 27.7 % (ref 12.0–46.0)
Lymphs Abs: 2.3 10*3/uL (ref 0.7–4.0)
MCHC: 33.5 g/dL (ref 30.0–36.0)
MCV: 90.6 fl (ref 78.0–100.0)
Monocytes Absolute: 0.4 10*3/uL (ref 0.1–1.0)
Monocytes Relative: 5.2 % (ref 3.0–12.0)
Neutro Abs: 5.4 10*3/uL (ref 1.4–7.7)
Neutrophils Relative %: 65.1 % (ref 43.0–77.0)
Platelets: 296 10*3/uL (ref 150.0–400.0)
RBC: 4.14 Mil/uL (ref 3.87–5.11)
RDW: 13.6 % (ref 11.5–15.5)
WBC: 8.4 10*3/uL (ref 4.0–10.5)

## 2014-07-07 LAB — LIPASE: LIPASE: 23 U/L (ref 11.0–59.0)

## 2014-07-08 ENCOUNTER — Ambulatory Visit: Payer: Self-pay | Admitting: Family Medicine

## 2014-07-09 ENCOUNTER — Encounter: Payer: Self-pay | Admitting: Family Medicine

## 2014-07-13 ENCOUNTER — Ambulatory Visit: Payer: Self-pay | Admitting: Chiropractic Medicine

## 2014-07-22 ENCOUNTER — Other Ambulatory Visit: Payer: Self-pay | Admitting: Family Medicine

## 2014-07-22 NOTE — Telephone Encounter (Signed)
Ok to refill 

## 2014-07-22 NOTE — Telephone Encounter (Signed)
plz phone in. 

## 2014-07-23 NOTE — Telephone Encounter (Signed)
Xanax called [facility] per Dr Sharen HonesGutierrez.

## 2014-08-15 ENCOUNTER — Other Ambulatory Visit: Payer: Self-pay | Admitting: Family Medicine

## 2014-08-15 DIAGNOSIS — E785 Hyperlipidemia, unspecified: Secondary | ICD-10-CM

## 2014-08-17 ENCOUNTER — Other Ambulatory Visit (INDEPENDENT_AMBULATORY_CARE_PROVIDER_SITE_OTHER): Payer: 59

## 2014-08-17 DIAGNOSIS — E785 Hyperlipidemia, unspecified: Secondary | ICD-10-CM

## 2014-08-17 LAB — LIPID PANEL
CHOL/HDL RATIO: 3
CHOLESTEROL: 225 mg/dL — AB (ref 0–200)
HDL: 70.5 mg/dL (ref >39.00)
LDL CALC: 116 mg/dL — AB (ref 0–99)
NONHDL: 154.5
TRIGLYCERIDES: 192 mg/dL — AB (ref 0.0–149.0)
VLDL: 38.4 mg/dL (ref 0.0–40.0)

## 2014-08-19 ENCOUNTER — Encounter: Payer: Self-pay | Admitting: *Deleted

## 2014-08-24 ENCOUNTER — Encounter: Payer: Self-pay | Admitting: Family Medicine

## 2014-08-24 ENCOUNTER — Ambulatory Visit (INDEPENDENT_AMBULATORY_CARE_PROVIDER_SITE_OTHER): Payer: 59 | Admitting: Family Medicine

## 2014-08-24 VITALS — BP 114/64 | HR 76 | Temp 98.1°F | Wt 157.8 lb

## 2014-08-24 DIAGNOSIS — M546 Pain in thoracic spine: Secondary | ICD-10-CM | POA: Diagnosis not present

## 2014-08-24 DIAGNOSIS — E785 Hyperlipidemia, unspecified: Secondary | ICD-10-CM

## 2014-08-24 DIAGNOSIS — R030 Elevated blood-pressure reading, without diagnosis of hypertension: Secondary | ICD-10-CM | POA: Diagnosis not present

## 2014-08-24 MED ORDER — OMEPRAZOLE 40 MG PO CPDR: 40.0000 mg | DELAYED_RELEASE_CAPSULE | Freq: Every day | ORAL | Status: DC

## 2014-08-24 NOTE — Assessment & Plan Note (Signed)
Reviewed #s, congratulated on improvement noted to date with TLC. Still not at goal however. Pt desires to continue 6 mo TLC and reassess control at next CPE.

## 2014-08-24 NOTE — Patient Instructions (Addendum)
Return in 6 months for physical Good job with cholesterol levels - keep up the good work. Sign release for records for latest cervical MRI and Dr Fredrich BirksStern's office note.  Have a great trip to [place].

## 2014-08-24 NOTE — Progress Notes (Signed)
Pre visit review using our clinic review tool, if applicable. No additional management support is needed unless otherwise documented below in the visit note. 

## 2014-08-24 NOTE — Progress Notes (Signed)
BP 114/64 mmHg  Pulse 76  Temp(Src) 98.1 F (36.7 C) (Oral)  Wt 157 lb 12 oz (71.555 kg)   CC: 6wk f/u visit  Subjective:    Patient ID: Jacqueline Wolf, female    DOB: 1950-06-05, 64 y.o.   MRN: [id number]  HPI: Jacqueline MarchKaren Wickey is a 64 y.o. female presenting on 08/24/2014 for Follow-up   HLD - significant diet changes have led to 30 point reduction in total cholesterol. Pt happy with this.   Chronic thoracic back pain - saw Dr Venetia MaxonStern who said no problem with neck, recommended f/u with Dr Ernest PineHooten for evaluation of R shoulder and scapula. Pain has improved some with less physical activity. PT and accupuncture didn't help in the past.   Relevant past medical, surgical, family and social history reviewed and updated as indicated. Interim medical history since our last visit reviewed. Allergies and medications reviewed and updated. Current Outpatient Prescriptions on File Prior to Visit  Medication Sig  . ALPRAZolam (XANAX) 0.5 MG tablet TAKE 1 TABLET BY MOUTH TWICE A DAY AS NEEDED FOR ANXIETY  . Calcium Carb-Cholecalciferol (CALCIUM-VITAMIN D3) 600-500 MG-UNIT CAPS Take 2 capsules by mouth daily.  Marland Kitchen. docusate sodium (COLACE) 100 MG capsule Take 100 mg by mouth daily.  Marland Kitchen. estradiol (ESTRACE) 2 MG tablet Take 2 mg by mouth daily.   . methocarbamol (ROBAXIN) 500 MG tablet TAKE 1 TABLET BY MOUTH EVERY 8 HOURS AS NEEDED FOR MUSCLE SPASMS  . Multiple Vitamin (MULTIVITAMIN) tablet Take 1 tablet by mouth daily.  . solifenacin (VESICARE) 5 MG tablet Take 5 mg by mouth daily.  . vitamin E 400 UNIT capsule Take 400 Units by mouth daily.   No current facility-administered medications on file prior to visit.    Review of Systems Per HPI unless specifically indicated above     Objective:    BP 114/64 mmHg  Pulse 76  Temp(Src) 98.1 F (36.7 C) (Oral)  Wt 157 lb 12 oz (71.555 kg)  Wt Readings from Last 3 Encounters:  08/24/14 157 lb 12 oz (71.555 kg)  07/06/14 156 lb 8 oz (70.988 kg)  04/01/14  152 lb 8 oz (69.174 kg)    Physical Exam  Constitutional: She appears well-developed and well-nourished. No distress.  HENT:  Head: Normocephalic and atraumatic.  Mouth/Throat: Oropharynx is clear and moist. No oropharyngeal exudate.  Cardiovascular: Normal rate, regular rhythm, normal heart sounds and intact distal pulses.   No murmur heard. Pulmonary/Chest: Effort normal and breath sounds normal. No respiratory distress. She has no wheezes. She has no rales.  Musculoskeletal: She exhibits no edema.  Mild discomfort to palpation R rhomboids  Skin: Skin is warm and dry. No rash noted.  Psychiatric: She has a normal mood and affect.  Nursing note and vitals reviewed.  Results for orders placed or performed in visit on 08/17/14  Lipid panel  Result Value Ref Range   Cholesterol 225 (H) 0 - 200 mg/dL   Triglycerides 469.6192.0 (H) 0.0 - 149.0 mg/dL   HDL 29.5270.50 >84.13>39.00 mg/dL   VLDL 24.438.4 0.0 - 01.040.0 mg/dL   LDL Cholesterol 272116 (H) 0 - 99 mg/dL   Total CHOL/HDL Ratio 3    NonHDL 154.50       Assessment & Plan:   Problem List Items Addressed This Visit    Thoracic back pain    Endorses she's been released from neurosurgery care with rec to f/u with ortho for scapular issues. Told no neurosurgical problem. I still have not received cervical  MRI report or latest neurosurgery office note - will have her sign ROI for these records today.      HLD (hyperlipidemia) - Primary    Reviewed #s, congratulated on improvement noted to date with TLC. Still not at goal however. Pt desires to continue 6 mo TLC and reassess control at next CPE.      RESOLVED: Elevated blood pressure reading without diagnosis of hypertension    Resolved - less stress, pain improving.          Follow up plan: Return in about 6 months (around 02/24/2015), or as needed, for annual exam, prior fasting for blood work.

## 2014-08-24 NOTE — Assessment & Plan Note (Signed)
Resolved - less stress, pain improving.

## 2014-08-24 NOTE — Assessment & Plan Note (Signed)
Endorses she's been released from neurosurgery care with rec to f/u with ortho for scapular issues. Told no neurosurgical problem. I still have not received cervical MRI report or latest neurosurgery office note - will have her sign ROI for these records today.

## 2014-09-03 ENCOUNTER — Encounter: Payer: Self-pay | Admitting: Family Medicine

## 2014-09-04 ENCOUNTER — Other Ambulatory Visit: Payer: Self-pay | Admitting: *Deleted

## 2014-09-10 ENCOUNTER — Encounter: Payer: Self-pay | Admitting: Family Medicine

## 2014-11-30 ENCOUNTER — Other Ambulatory Visit: Payer: Self-pay | Admitting: Family Medicine

## 2014-11-30 NOTE — Telephone Encounter (Signed)
Electronic refill request. Last Filled:    40 tablet 0 RF on 07/22/2014  Please advise.

## 2014-11-30 NOTE — Telephone Encounter (Signed)
Please call in.  Thanks.   

## 2014-12-01 NOTE — Telephone Encounter (Signed)
Medication phoned to pharmacy.  

## 2014-12-09 ENCOUNTER — Telehealth: Payer: Self-pay | Admitting: Family Medicine

## 2014-12-09 MED ORDER — OMEPRAZOLE 20 MG PO CPDR: 20.0000 mg | DELAYED_RELEASE_CAPSULE | Freq: Every day | ORAL | Status: DC

## 2014-12-09 NOTE — Telephone Encounter (Signed)
Sent in :  Hi there,  I am sending this message on behalf of my wife, Jacqueline Wolf. She has requested me to contact you about changing her omeprazole back to  instead of  which she is currently taking. She is having more of a constipation problem with the higher dosage. She is requesting a 90 day supply with 3 refills.   Thanks, Albertha Ghee

## 2014-12-22 ENCOUNTER — Encounter: Payer: Self-pay | Admitting: Family Medicine

## 2014-12-22 ENCOUNTER — Ambulatory Visit (INDEPENDENT_AMBULATORY_CARE_PROVIDER_SITE_OTHER): Payer: 59 | Admitting: Family Medicine

## 2014-12-22 VITALS — BP 118/70 | HR 72 | Temp 97.6°F | Wt 156.8 lb

## 2014-12-22 DIAGNOSIS — M546 Pain in thoracic spine: Secondary | ICD-10-CM

## 2014-12-22 DIAGNOSIS — R21 Rash and other nonspecific skin eruption: Secondary | ICD-10-CM | POA: Diagnosis not present

## 2014-12-22 NOTE — Progress Notes (Signed)
   BP 118/70 mmHg  Pulse 72  Temp(Src) 97.6 F (36.4 C) (Oral)  Wt 156 lb 12 oz (71.101 kg)   CC: check thigh insect bite  Subjective:    Patient ID: Jacqueline Wolf, female    DOB: March 16, 1951, 64 y.o.   MRN: [id number]  HPI: Nazaret Chea is a 64 y.o. female presenting on 12/22/2014 for Insect Bite   Recent birthday - was outdoors a long time. Thinks had several bug bites to lower legs. Over weekend redness developing around bites. + itching.  No fevers, no nausea.   Treated with hot compresses. Hasn't tried anything else for this.  Upcoming trip to [place].   Relevant past medical, surgical, family and social history reviewed and updated as indicated. Interim medical history since our last visit reviewed. Allergies and medications reviewed and updated. Current Outpatient Prescriptions on File Prior to Visit  Medication Sig  . ALPRAZolam (XANAX) 0.5 MG tablet TAKE 1 TABLET BY MOUTH TWICE A DAY  . Calcium Carb-Cholecalciferol (CALCIUM-VITAMIN D3) 600-500 MG-UNIT CAPS Take 2 capsules by mouth daily.  Marland Kitchen docusate sodium (COLACE) 100 MG capsule Take 100 mg by mouth daily.  Marland Kitchen estradiol (ESTRACE) 2 MG tablet Take 2 mg by mouth daily.   . methocarbamol (ROBAXIN) 500 MG tablet TAKE 1 TABLET BY MOUTH EVERY 8 HOURS AS NEEDED FOR MUSCLE SPASMS  . Multiple Vitamin (MULTIVITAMIN) tablet Take 1 tablet by mouth daily.  Marland Kitchen omeprazole (PRILOSEC) 20 MG capsule Take 1 capsule (20 mg total) by mouth daily.  . solifenacin (VESICARE) 5 MG tablet Take 5 mg by mouth daily.  . vitamin E 400 UNIT capsule Take 400 Units by mouth daily.   No current facility-administered medications on file prior to visit.    Review of Systems Per HPI unless specifically indicated above     Objective:    BP 118/70 mmHg  Pulse 72  Temp(Src) 97.6 F (36.4 C) (Oral)  Wt 156 lb 12 oz (71.101 kg)  Wt Readings from Last 3 Encounters:  12/22/14 156 lb 12 oz (71.101 kg)  08/24/14 157 lb 12 oz (71.555 kg)  07/06/14 156 lb  8 oz (70.988 kg)    Physical Exam  Constitutional: She appears well-developed and well-nourished. No distress.  Skin: Skin is warm and dry. Rash noted. There is erythema.  Erosions x2 at crease R thigh/groin crease with mild surrounding erythema and inflammation  Vitals reviewed.  Results for orders placed or performed in visit on 08/17/14  Lipid panel  Result Value Ref Range   Cholesterol 225 (H) 0 - 200 mg/dL   Triglycerides 782.9 (H) 0.0 - 149.0 mg/dL   HDL 56.21 >30.86 mg/dL   VLDL 57.8 0.0 - 46.9 mg/dL   LDL Cholesterol 629 (H) 0 - 99 mg/dL   Total CHOL/HDL Ratio 3    NonHDL 154.50       Assessment & Plan:   Problem List Items Addressed This Visit    Thoracic back pain   Skin rash - Primary    Recent bug bites with surrounding erythema now healing. Consistent with inflammation not infection/cellulitis. Reassured. Treat with hydrocortisone cream + triple abx cream. Discussed red flags to seek re eval for infection/cellulitis.          Follow up plan: Return if symptoms worsen or fail to improve.

## 2014-12-22 NOTE — Patient Instructions (Signed)
Spots on legs look like local reaction to bug bite, not cellulitis. Treat with hydrocortisone cream and triple antiboitic cream. Let us know if any spreading redness, or fever or not improving as expected.

## 2014-12-22 NOTE — Assessment & Plan Note (Signed)
Recent bug bites with surrounding erythema now healing. Consistent with inflammation not infection/cellulitis. Reassured. Treat with hydrocortisone cream + triple abx cream. Discussed red flags to seek re eval for infection/cellulitis.

## 2014-12-22 NOTE — Progress Notes (Signed)
Pre visit review using our clinic review tool, if applicable. No additional management support is needed unless otherwise documented below in the visit note. 

## 2015-02-09 ENCOUNTER — Encounter: Payer: Self-pay | Admitting: Family Medicine

## 2015-02-11 MED ORDER — SOLIFENACIN SUCCINATE 5 MG PO TABS: 5.0000 mg | ORAL_TABLET | Freq: Every day | ORAL | Status: DC

## 2015-02-21 ENCOUNTER — Other Ambulatory Visit: Payer: Self-pay | Admitting: Family Medicine

## 2015-02-21 DIAGNOSIS — E785 Hyperlipidemia, unspecified: Secondary | ICD-10-CM

## 2015-02-23 ENCOUNTER — Other Ambulatory Visit (INDEPENDENT_AMBULATORY_CARE_PROVIDER_SITE_OTHER): Payer: 59

## 2015-02-23 DIAGNOSIS — E785 Hyperlipidemia, unspecified: Secondary | ICD-10-CM | POA: Diagnosis not present

## 2015-02-23 LAB — BASIC METABOLIC PANEL
BUN: 10 mg/dL (ref 6–23)
CHLORIDE: 103 meq/L (ref 96–112)
CO2: 28 mEq/L (ref 19–32)
CREATININE: 0.6 mg/dL (ref 0.40–1.20)
Calcium: 9.3 mg/dL (ref 8.4–10.5)
GFR: 106.91 mL/min (ref >60.00)
Glucose, Bld: 103 mg/dL — ABNORMAL HIGH (ref 70–99)
POTASSIUM: 3.9 meq/L (ref 3.5–5.1)
Sodium: 138 mEq/L (ref 135–145)

## 2015-02-23 LAB — LIPID PANEL
CHOL/HDL RATIO: 3
Cholesterol: 235 mg/dL — ABNORMAL HIGH (ref 0–200)
HDL: 75.8 mg/dL (ref >39.00)
NONHDL: 158.94
TRIGLYCERIDES: 219 mg/dL — AB (ref 0.0–149.0)
VLDL: 43.8 mg/dL — ABNORMAL HIGH (ref 0.0–40.0)

## 2015-02-23 LAB — LDL CHOLESTEROL, DIRECT: LDL DIRECT: 129 mg/dL

## 2015-02-26 ENCOUNTER — Encounter: Payer: 59 | Admitting: Family Medicine

## 2015-03-01 ENCOUNTER — Ambulatory Visit (INDEPENDENT_AMBULATORY_CARE_PROVIDER_SITE_OTHER): Payer: 59 | Admitting: Family Medicine

## 2015-03-01 ENCOUNTER — Encounter: Payer: Self-pay | Admitting: Family Medicine

## 2015-03-01 VITALS — BP 130/74 | HR 76 | Temp 98.1°F | Ht 63.0 in | Wt 158.5 lb

## 2015-03-01 DIAGNOSIS — R32 Unspecified urinary incontinence: Secondary | ICD-10-CM

## 2015-03-01 DIAGNOSIS — Z0000 Encounter for general adult medical examination without abnormal findings: Secondary | ICD-10-CM | POA: Diagnosis not present

## 2015-03-01 DIAGNOSIS — E8940 Asymptomatic postprocedural ovarian failure: Secondary | ICD-10-CM

## 2015-03-01 DIAGNOSIS — M8580 Other specified disorders of bone density and structure, unspecified site: Secondary | ICD-10-CM

## 2015-03-01 DIAGNOSIS — M546 Pain in thoracic spine: Secondary | ICD-10-CM

## 2015-03-01 DIAGNOSIS — K200 Eosinophilic esophagitis: Secondary | ICD-10-CM

## 2015-03-01 DIAGNOSIS — F410 Panic disorder [episodic paroxysmal anxiety] without agoraphobia: Secondary | ICD-10-CM

## 2015-03-01 DIAGNOSIS — Z79890 Hormone replacement therapy (postmenopausal): Secondary | ICD-10-CM

## 2015-03-01 DIAGNOSIS — E785 Hyperlipidemia, unspecified: Secondary | ICD-10-CM

## 2015-03-01 MED ORDER — FISH OIL 1000 MG PO CAPS
1.0000 | ORAL_CAPSULE | Freq: Every day | ORAL | Status: AC
Start: 2015-03-01 — End: ?

## 2015-03-01 NOTE — Assessment & Plan Note (Signed)
Stable on PPI omeprazole 20mg  daily.

## 2015-03-01 NOTE — Progress Notes (Signed)
Pre visit review using our clinic review tool, if applicable. No additional management support is needed unless otherwise documented below in the visit note. 

## 2015-03-01 NOTE — Assessment & Plan Note (Signed)
Still marked improvement with trigger injections by Dr Yves Dillhasnis.

## 2015-03-01 NOTE — Assessment & Plan Note (Signed)
Reviewed deteriorated #s with patient. Pt motivated to make healthy changes to improve cholesterol levels

## 2015-03-01 NOTE — Assessment & Plan Note (Signed)
Discussed calcium and vit D intake.

## 2015-03-01 NOTE — Assessment & Plan Note (Signed)
Rare xanax use. Came close to panic attack last week. sxs ongoing since [id number].

## 2015-03-01 NOTE — Patient Instructions (Addendum)
Sign release of records up front for colonoscopy report Dr Tiajuana Amass Antion[facility] Fair[facility]).  Work on triglycerides. Return for welcome to medicare visit after you receive medicare.  Health Maintenance, Female Adopting a healthy lifestyle and getting preventive care can go a long way to promote health and wellness. Talk with your health care provider about what schedule of regular examinations is right for you. This is a good chance for you to check in with your provider about disease prevention and staying healthy. In between checkups, there are plenty of things you can do on your own. Experts have done a lot of research about which lifestyle changes and preventive measures are most likely to keep you healthy. Ask your health care provider for more information. WEIGHT AND DIET  Eat a healthy diet  Be sure to include plenty of vegetables, fruits, low-fat dairy products, and lean protein.  Do not eat a lot of foods high in solid fats, added sugars, or salt.  Get regular exercise. This is one of the most important things you can do for your health.  Most adults should exercise for at least 150 minutes each week. The exercise should increase your heart rate and make you sweat (moderate-intensity exercise).  Most adults should also do strengthening exercises at least twice a week. This is in addition to the moderate-intensity exercise.  Maintain a healthy weight  Body mass index (BMI) is a measurement that can be used to identify possible weight problems. It estimates body fat based on height and weight. Your health care provider can help determine your BMI and help you achieve or maintain a healthy weight.  For females 64 years of age and older:   A BMI below 18.5 is considered underweight.  A BMI of 18.5 to 24.9 is normal.  A BMI of 25 to 29.9 is considered overweight.  A BMI of 30 and above is considered obese.  Watch levels of cholesterol and blood lipids  You should start having your  blood tested for lipids and cholesterol at 64 years of age, then have this test every 5 years.  You may need to have your cholesterol levels checked more often if:  Your lipid or cholesterol levels are high.  You are older than 64 years of age.  You are at high risk for heart disease.  CANCER SCREENING   Lung Cancer  Lung cancer screening is recommended for adults 31-64 years old who are at high risk for lung cancer because of a history of smoking.  A yearly low-dose CT scan of the lungs is recommended for people who:  Currently smoke.  Have quit within the past 15 years.  Have at least a 30-pack-year history of smoking. A pack year is smoking an average of one pack of cigarettes a day for 1 year.  Yearly screening should continue until it has been 15 years since you quit.  Yearly screening should stop if you develop a health problem that would prevent you from having lung cancer treatment.  Breast Cancer  Practice breast self-awareness. This means understanding how your breasts normally appear and feel.  It also means doing regular breast self-exams. Let your health care provider know about any changes, no matter how small.  If you are in your 20s or 30s, you should have a clinical breast exam (CBE) by a health care provider every 1-3 years as part of a regular health exam.  If you are 38 or older, have a CBE every year. Also consider having  a breast X-ray (mammogram) every year.  If you have a family history of breast cancer, talk to your health care provider about genetic screening.  If you are at high risk for breast cancer, talk to your health care provider about having an MRI and a mammogram every year.  Breast cancer gene (BRCA) assessment is recommended for women who have family members with BRCA-related cancers. BRCA-related cancers include:  Breast.  Ovarian.  Tubal.  Peritoneal cancers.  Results of the assessment will determine the need for genetic  counseling and BRCA1 and BRCA2 testing. Cervical Cancer Your health care provider may recommend that you be screened regularly for cancer of the pelvic organs (ovaries, uterus, and vagina). This screening involves a pelvic examination, including checking for microscopic changes to the surface of your cervix (Pap test). You may be encouraged to have this screening done every 3 years, beginning at age 64.  For women ages 51-64, health care providers may recommend pelvic exams and Pap testing every 3 years, or they may recommend the Pap and pelvic exam, combined with testing for human papilloma virus (HPV), every 5 years. Some types of HPV increase your risk of cervical cancer. Testing for HPV may also be done on women of any age with unclear Pap test results.  Other health care providers may not recommend any screening for nonpregnant women who are considered low risk for pelvic cancer and who do not have symptoms. Ask your health care provider if a screening pelvic exam is right for you.  If you have had past treatment for cervical cancer or a condition that could lead to cancer, you need Pap tests and screening for cancer for at least 20 years after your treatment. If Pap tests have been discontinued, your risk factors (such as having a new sexual partner) need to be reassessed to determine if screening should resume. Some women have medical problems that increase the chance of getting cervical cancer. In these cases, your health care provider may recommend more frequent screening and Pap tests. Colorectal Cancer  This type of cancer can be detected and often prevented.  Routine colorectal cancer screening usually begins at 64 years of age and continues through 64 years of age.  Your health care provider may recommend screening at an earlier age if you have risk factors for colon cancer.  Your health care provider may also recommend using home test kits to check for hidden blood in the stool.  A  small camera at the end of a tube can be used to examine your colon directly (sigmoidoscopy or colonoscopy). This is done to check for the earliest forms of colorectal cancer.  Routine screening usually begins at age 79.  Direct examination of the colon should be repeated every 5-10 years through 64 years of age. However, you may need to be screened more often if early forms of precancerous polyps or small growths are found. Skin Cancer  Check your skin from head to toe regularly.  Tell your health care provider about any new moles or changes in moles, especially if there is a change in a mole's shape or color.  Also tell your health care provider if you have a mole that is larger than the size of a pencil eraser.  Always use sunscreen. Apply sunscreen liberally and repeatedly throughout the day.  Protect yourself by wearing long sleeves, pants, a wide-brimmed hat, and sunglasses whenever you are outside. HEART DISEASE, DIABETES, AND HIGH BLOOD PRESSURE   High blood pressure  causes heart disease and increases the risk of stroke. High blood pressure is more likely to develop in:  People who have blood pressure in the high end of the normal range (130-139/85-89 mm Hg).  People who are overweight or obese.  People who are African American.  If you are 97-30 years of age, have your blood pressure checked every 3-5 years. If you are 53 years of age or older, have your blood pressure checked every year. You should have your blood pressure measured twice--once when you are at a hospital or clinic, and once when you are not at a hospital or clinic. Record the average of the two measurements. To check your blood pressure when you are not at a hospital or clinic, you can use:  An automated blood pressure machine at a pharmacy.  A home blood pressure monitor.  If you are between 79 years and 88 years old, ask your health care provider if you should take aspirin to prevent strokes.  Have  regular diabetes screenings. This involves taking a blood sample to check your fasting blood sugar level.  If you are at a normal weight and have a low risk for diabetes, have this test once every three years after 64 years of age.  If you are overweight and have a high risk for diabetes, consider being tested at a younger age or more often. PREVENTING INFECTION  Hepatitis B  If you have a higher risk for hepatitis B, you should be screened for this virus. You are considered at high risk for hepatitis B if:  You were born in a country where hepatitis B is common. Ask your health care provider which countries are considered high risk.  Your parents were born in a high-risk country, and you have not been immunized against hepatitis B (hepatitis B vaccine).  You have HIV or AIDS.  You use needles to inject street drugs.  You live with someone who has hepatitis B.  You have had sex with someone who has hepatitis B.  You get hemodialysis treatment.  You take certain medicines for conditions, including cancer, organ transplantation, and autoimmune conditions. Hepatitis C  Blood testing is recommended for:  Everyone born from [birth year] through 1965.  Anyone with known risk factors for hepatitis C. Sexually transmitted infections (STIs)  You should be screened for sexually transmitted infections (STIs) including gonorrhea and chlamydia if:  You are sexually active and are younger than 64 years of age.  You are older than 64 years of age and your health care provider tells you that you are at risk for this type of infection.  Your sexual activity has changed since you were last screened and you are at an increased risk for chlamydia or gonorrhea. Ask your health care provider if you are at risk.  If you do not have HIV, but are at risk, it may be recommended that you take a prescription medicine daily to prevent HIV infection. This is called pre-exposure prophylaxis (PrEP). You are  considered at risk if:  You are sexually active and do not regularly use condoms or know the HIV status of your partner(s).  You take drugs by injection.  You are sexually active with a partner who has HIV. Talk with your health care provider about whether you are at high risk of being infected with HIV. If you choose to begin PrEP, you should first be tested for HIV. You should then be tested every 3 months for as long as you  are taking PrEP.  PREGNANCY   If you are premenopausal and you may become pregnant, ask your health care provider about preconception counseling.  If you may become pregnant, take 400 to 800 micrograms (mcg) of folic acid every day.  If you want to prevent pregnancy, talk to your health care provider about birth control (contraception). OSTEOPOROSIS AND MENOPAUSE   Osteoporosis is a disease in which the bones lose minerals and strength with aging. This can result in serious bone fractures. Your risk for osteoporosis can be identified using a bone density scan.  If you are 64 years of age or older, or if you are at risk for osteoporosis and fractures, ask your health care provider if you should be screened.  Ask your health care provider whether you should take a calcium or vitamin D supplement to lower your risk for osteoporosis.  Menopause may have certain physical symptoms and risks.  Hormone replacement therapy may reduce some of these symptoms and risks. Talk to your health care provider about whether hormone replacement therapy is right for you.  HOME CARE INSTRUCTIONS   Schedule regular health, dental, and eye exams.  Stay current with your immunizations.   Do not use any tobacco products including cigarettes, chewing tobacco, or electronic cigarettes.  If you are pregnant, do not drink alcohol.  If you are breastfeeding, limit how much and how often you drink alcohol.  Limit alcohol intake to no more than 1 drink per day for nonpregnant women. One  drink equals 12 ounces of beer, 5 ounces of wine, or 1 ounces of hard liquor.  Do not use street drugs.  Do not share needles.  Ask your health care provider for help if you need support or information about quitting drugs.  Tell your health care provider if you often feel depressed.  Tell your health care provider if you have ever been abused or do not feel safe at home.   This information is not intended to replace advice given to you by your health care provider. Make sure you discuss any questions you have with your health care provider.   Document Released: 10/24/2010 Document Revised: 05/01/2014 Document Reviewed: 03/12/2013 Elsevier Interactive Patient Education [employer].

## 2015-03-01 NOTE — Assessment & Plan Note (Signed)
Continue vesicare 5 mg daily.  

## 2015-03-01 NOTE — Assessment & Plan Note (Signed)
Preventative protocols reviewed and updated unless pt declined. Discussed healthy diet and lifestyle.  

## 2015-03-01 NOTE — Progress Notes (Signed)
BP 130/74 mmHg  Pulse 76  Temp(Src) 98.1 F (36.7 C) (Oral)  Ht 5\' 3"  (1.6 m)  Wt 158 lb 8 oz (71.895 kg)  BMI 28.08 kg/m2   CC: CPE  Subjective:    Patient ID: Jacqueline Wolf, female    DOB: August 09, 1950, [age] y.o.   MRN: [id number]  HPI: Jacqueline Wolf is a 64 y.o. female presenting on 03/01/2015 for Annual Exam   Pt asked me to start filling vesicare. Records requested from urology. [facility] [place] [facility] [place]   Preventative: ESOPHAGOGASTRODUODENOSCOPY Date: 2013 eosinophilic esophagitis  Colon cancer screening - 03/2012 diverticulosis o/w normal per patient Charlies Sil[facility]) Well woman - s/p hysterectomy and oophorectomy. On estrogen since 1996. Difficulty weaning off in the past.  mammo - 03/2014 Birads 1  DEXA Date: 2009 WNL, mild osteopenia T -1.0 Flu shot 12/2014 Tetanus - 2010  Shingles - 02/2014 Seat belt use discussed Sunscreen use discussed. No changing moles on skin.  Lives with husband and dog Occupation: [employer] Edu: HS Activity: pilates, walking  Relevant past medical, surgical, family and social history reviewed and updated as indicated. Interim medical history since our last visit reviewed. Allergies and medications reviewed and updated. Current Outpatient Prescriptions on File Prior to Visit  Medication Sig  . ALPRAZolam (XANAX) 0.5 MG tablet TAKE 1 TABLET BY MOUTH TWICE A DAY  . Calcium Carb-Cholecalciferol (CALCIUM-VITAMIN D3) 600-500 MG-UNIT CAPS Take 1 capsule by mouth daily.   Marland Kitchen. docusate sodium (COLACE) 100 MG capsule Take 100 mg by mouth daily.  . methocarbamol (ROBAXIN) 500 MG tablet TAKE 1 TABLET BY MOUTH EVERY 8 HOURS AS NEEDED FOR MUSCLE SPASMS  . Multiple Vitamin (MULTIVITAMIN) tablet Take 1 tablet by mouth daily.  Marland Kitchen. omeprazole (PRILOSEC) 20 MG capsule Take 1 capsule (20 mg total) by mouth daily.  . solifenacin (VESICARE) 5 MG tablet Take 1 tablet (5 mg total) by mouth daily.  . vitamin E 400 UNIT capsule  Take 400 Units by mouth daily.   No current facility-administered medications on file prior to visit.    Review of Systems  Constitutional: Negative for fever, chills, activity change, appetite change, fatigue and unexpected weight change.  HENT: Negative for hearing loss.   Eyes: Negative for visual disturbance.  Respiratory: Negative for cough, chest tightness, shortness of breath and wheezing.   Cardiovascular: Negative for chest pain, palpitations and leg swelling.  Gastrointestinal: Negative for nausea, vomiting, abdominal pain, diarrhea, constipation, blood in stool and abdominal distention.  Genitourinary: Negative for hematuria and difficulty urinating.  Musculoskeletal: Negative for myalgias, arthralgias and neck pain.  Skin: Negative for rash.  Neurological: Negative for dizziness, seizures, syncope and headaches.  Hematological: Negative for adenopathy. Does not bruise/bleed easily.  Psychiatric/Behavioral: Negative for dysphoric mood. The patient is nervous/anxious (discussed panic attacks).    Per HPI unless specifically indicated in ROS section     Objective:    BP 130/74 mmHg  Pulse 76  Temp(Src) 98.1 F (36.7 C) (Oral)  Ht 5\' 3"  (1.6 m)  Wt 158 lb 8 oz (71.895 kg)  BMI 28.08 kg/m2  Wt Readings from Last 3 Encounters:  03/01/15 158 lb 8 oz (71.895 kg)  12/22/14 156 lb 12 oz (71.101 kg)  08/24/14 157 lb 12 oz (71.555 kg)    Physical Exam  Constitutional: She is oriented to person, place, and time. She appears well-developed and well-nourished. No distress.  HENT:  Head: Normocephalic and atraumatic.  Right Ear: Hearing, tympanic membrane, external  ear and ear canal normal.  Left Ear: Hearing, tympanic membrane, external ear and ear canal normal.  Nose: Nose normal.  Mouth/Throat: Uvula is midline, oropharynx is clear and moist and mucous membranes are normal. No oropharyngeal exudate, posterior oropharyngeal edema or posterior oropharyngeal erythema.  Eyes:  Conjunctivae and EOM are normal. Pupils are equal, round, and reactive to light. No scleral icterus.  Neck: Normal range of motion. Neck supple. Carotid bruit is not present. No thyromegaly present.  Cardiovascular: Normal rate, regular rhythm, normal heart sounds and intact distal pulses.   No murmur heard. Pulses:      Radial pulses are 2+ on the right side, and 2+ on the left side.  Pulmonary/Chest: Effort normal and breath sounds normal. No respiratory distress. She has no wheezes. She has no rales.  Abdominal: Soft. Bowel sounds are normal. She exhibits no distension and no mass. There is no tenderness. There is no rebound and no guarding.  Musculoskeletal: Normal range of motion. She exhibits no edema.  Lymphadenopathy:    She has no cervical adenopathy.  Neurological: She is alert and oriented to person, place, and time.  CN grossly intact, station and gait intact  Skin: Skin is warm and dry. No rash noted.  Psychiatric: She has a normal mood and affect. Her behavior is normal. Judgment and thought content normal.  Nursing note and vitals reviewed.  Results for orders placed or performed in visit on 02/23/15  Lipid panel  Result Value Ref Range   Cholesterol 235 (H) 0 - 200 mg/dL   Triglycerides 161.0 (H) 0.0 - 149.0 mg/dL   HDL 96.04 >54.09 mg/dL   VLDL 81.1 (H) 0.0 - 91.4 mg/dL   Total CHOL/HDL Ratio 3    NonHDL 158.94   Basic metabolic panel  Result Value Ref Range   Sodium 138 135 - 145 mEq/L   Potassium 3.9 3.5 - 5.1 mEq/L   Chloride 103 96 - 112 mEq/L   CO2 28 19 - 32 mEq/L   Glucose, Bld 103 (H) 70 - 99 mg/dL   BUN 10 6 - 23 mg/dL   Creatinine, Ser 7.82 0.40 - 1.20 mg/dL   Calcium 9.3 8.4 - 95.6 mg/dL   GFR 213.08 >65.78 mL/min  LDL cholesterol, direct  Result Value Ref Range   Direct LDL 129.0 mg/dL      Assessment & Plan:   Problem List Items Addressed This Visit    Urine incontinence    Continue vesicare  daily.      Thoracic back pain    Still  marked improvement with trigger injections by Dr Yves Dill.       Surgical menopause on hormone replacement therapy    Discussed slower taper of HRT - rec estradiol  alternating with  every other day      Panic attacks    Rare xanax use. Came close to panic attack last week. sxs ongoing since 69s.       Osteopenia    Discussed calcium and vit D intake.       HLD (hyperlipidemia)    Reviewed deteriorated #s with patient. Pt motivated to make healthy changes to improve cholesterol levels      Health care maintenance - Primary    Preventative protocols reviewed and updated unless pt declined. Discussed healthy diet and lifestyle.       Eosinophilic esophagitis    Stable on PPI omeprazole  daily.          Follow up plan: Return as  needed, for welcome to medicare.

## 2015-03-01 NOTE — Assessment & Plan Note (Signed)
Discussed slower taper of HRT - rec estradiol 1mg  alternating with 2mg  every other day

## 2015-03-02 ENCOUNTER — Other Ambulatory Visit: Payer: Self-pay | Admitting: Family Medicine

## 2015-03-04 ENCOUNTER — Encounter: Payer: Self-pay | Admitting: Family Medicine

## 2015-05-10 ENCOUNTER — Other Ambulatory Visit: Payer: Self-pay | Admitting: Family Medicine

## 2015-05-12 NOTE — Telephone Encounter (Signed)
plz phone in. 

## 2015-05-12 NOTE — Telephone Encounter (Signed)
Rx called in as directed.   

## 2015-06-08 ENCOUNTER — Encounter: Payer: Self-pay | Admitting: Family Medicine

## 2015-06-08 ENCOUNTER — Ambulatory Visit (INDEPENDENT_AMBULATORY_CARE_PROVIDER_SITE_OTHER): Payer: 59 | Admitting: Family Medicine

## 2015-06-08 VITALS — BP 118/76 | HR 84 | Temp 97.7°F | Wt 153.8 lb

## 2015-06-08 DIAGNOSIS — L298 Other pruritus: Secondary | ICD-10-CM

## 2015-06-08 DIAGNOSIS — N898 Other specified noninflammatory disorders of vagina: Secondary | ICD-10-CM

## 2015-06-08 MED ORDER — FLUCONAZOLE 150 MG PO TABS: 150.0000 mg | ORAL_TABLET | Freq: Once | ORAL | Status: DC

## 2015-06-08 NOTE — Addendum Note (Signed)
Addended by: Baldomero Lamy on: 06/08/2015 03:46 PM   Modules accepted: Orders

## 2015-06-08 NOTE — Patient Instructions (Signed)
Anticipate candidal infection - treat with diflucan x1, may repeat in 4 d if persistent symptoms. Let us know if not improved with treatment.  Monilial Vaginitis Vaginitis in a soreness, swelling and redness (inflammation) of the vagina and vulva. Monilial vaginitis is not a sexually transmitted infection. CAUSES  Yeast vaginitis is caused by yeast (candida) that is normally found in your vagina. With a yeast infection, the candida has overgrown in number to a point that upsets the chemical balance. SYMPTOMS   White, thick vaginal discharge.  Swelling, itching, redness and irritation of the vagina and possibly the lips of the vagina (vulva).  Burning or painful urination.  Painful intercourse. DIAGNOSIS  Things that may contribute to monilial vaginitis are:  Postmenopausal and virginal states.  Pregnancy.  Infections.  Being tired, sick or stressed, especially if you had monilial vaginitis in the past.  Diabetes. Good control will help lower the chance.  Birth control pills.  Tight fitting garments.  Using bubble bath, feminine sprays, douches or deodorant tampons.  Taking certain medications that kill germs (antibiotics).  Sporadic recurrence can occur if you become ill. TREATMENT  Your caregiver will give you medication.  There are several kinds of anti monilial vaginal creams and suppositories specific for monilial vaginitis. For recurrent yeast infections, use a suppository or cream in the vagina 2 times a week, or as directed.  Anti-monilial or steroid cream for the itching or irritation of the vulva may also be used. Get your caregiver's permission.  Painting the vagina with methylene blue solution may help if the monilial cream does not work.  Eating yogurt may help prevent monilial vaginitis. HOME CARE INSTRUCTIONS   Finish all medication as prescribed.  Do not have sex until treatment is completed or after your caregiver tells you it is okay.  Take warm  sitz baths.  Do not douche.  Do not use tampons, especially scented ones.  Wear cotton underwear.  Avoid tight pants and panty hose.  Tell your sexual partner that you have a yeast infection. They should go to their caregiver if they have symptoms such as mild rash or itching.  Your sexual partner should be treated as well if your infection is difficult to eliminate.  Practice safer sex. Use condoms.  Some vaginal medications cause latex condoms to fail. Vaginal medications that harm condoms are:  Cleocin cream.  Butoconazole (Femstat).  Terconazole (Terazol) vaginal suppository.  Miconazole (Monistat) (may be purchased over the counter). SEEK MEDICAL CARE IF:   You have a temperature by mouth above 102 F (38.9 C).  The infection is getting worse after 2 days of treatment.  The infection is not getting better after 3 days of treatment.  You develop blisters in or around your vagina.  You develop vaginal bleeding, and it is not your menstrual period.  You have pain when you urinate.  You develop intestinal problems.  You have pain with sexual intercourse.   This information is not intended to replace advice given to you by your health care provider. Make sure you discuss any questions you have with your health care provider.   Document Released: 01/18/2005 Document Revised: 07/03/2011 Document Reviewed: 10/12/2014 Elsevier Interactive Patient Education [employer]

## 2015-06-08 NOTE — Progress Notes (Signed)
Pre visit review using our clinic review tool, if applicable. No additional management support is needed unless otherwise documented below in the visit note. 

## 2015-06-08 NOTE — Progress Notes (Signed)
   BP 118/76 mmHg  Pulse 84  Temp(Src) 97.7 F (36.5 C) (Oral)  Wt 153 lb 12 oz (69.741 kg)   CC: vag itching  Subjective:    Patient ID: Jacqueline Wolf, female    DOB: [birth year], 65 y.o.   MRN: [id number]  HPI: Jacqueline Wolf is a 65 y.o. female presenting on 06/08/2015 for Vaginal Itching   Several week history of vaginal itching. Mild white discharge. Self treatment with monistat has not improved. No odor. Now noticing itching other parts of skin (lateral breasts, axilla). Uses pad.   No fevers/chills, abd pain, nausea, dysuria, flank pain.  She did have crown removed November and was on abx course. This may have precipitated current symptoms.   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   Relevant past medical, surgical, family and social history reviewed and updated as indicated. Interim medical history since our last visit reviewed. Allergies and medications reviewed and updated. Current Outpatient Prescriptions on File Prior to Visit  Medication Sig  . ALPRAZolam (XANAX) 0.5 MG tablet TAKE 1 TABLET BY MOUTH TWICE DAILY  . Calcium Carb-Cholecalciferol (CALCIUM-VITAMIN D3) 600-500 MG-UNIT CAPS Take 1 capsule by mouth daily.   Marland Kitchen docusate sodium (COLACE) 100 MG capsule Take 100 mg by mouth daily.  Marland Kitchen estradiol (ESTRACE) 2 MG tablet TAKE 1 TABLET (2 MG TOTAL) BY MOUTH DAILY.  . Multiple Vitamin (MULTIVITAMIN) tablet Take 1 tablet by mouth daily.  . Omega-3 Fatty Acids (FISH OIL) 1000 MG CAPS Take 1 capsule (1,000 mg total) by mouth daily.  Marland Kitchen omeprazole (PRILOSEC) 20 MG capsule Take 1 capsule (20 mg total) by mouth daily.  . solifenacin (VESICARE) 5 MG tablet Take 1 tablet (5 mg total) by mouth daily.  . vitamin E 400 UNIT capsule Take 400 Units by mouth daily.   No current facility-administered medications on file prior to visit.    Review of Systems Per HPI unless specifically indicated in ROS  section     Objective:    BP 118/76 mmHg  Pulse 84  Temp(Src) 97.7 F (36.5 C) (Oral)  Wt 153 lb 12 oz (69.741 kg)  Wt Readings from Last 3 Encounters:  06/08/15 153 lb 12 oz (69.741 kg)  03/01/15 158 lb 8 oz (71.895 kg)  12/22/14 156 lb 12 oz (71.101 kg)    Physical Exam  Constitutional: She appears well-developed and well-nourished. No distress.  Genitourinary: Vagina normal. Pelvic exam was performed with patient supine. There is no rash, tenderness, lesion or injury on the right labia. There is no rash, tenderness, lesion or injury on the left labia.  Wet prep collected and sent off  Nursing note and vitals reviewed.  Results for orders placed or performed in visit on 03/04/15  HM COLONOSCOPY  Result Value Ref Range   HM Colonoscopy diverticulosis, rpt 10 yrs       Assessment & Plan:   Problem List Items Addressed This Visit    Vaginal itching - Primary    With vaginal discharge although no significant discharge found on exam today. Regardless given story will treat as yeast infection with diflucan x1, may rpt x1 if needed.  Wet prep sent. Update if persistent sxs.      Relevant Orders   Wet prep, genital       Follow up plan: Return if symptoms worsen or fail to improve.

## 2015-06-08 NOTE — Assessment & Plan Note (Signed)
With vaginal discharge although no significant discharge found on exam today. Regardless given story will treat as yeast infection with diflucan x1, may rpt x1 if needed.  Wet prep sent. Update if persistent sxs.

## 2015-06-09 LAB — WET PREP BY MOLECULAR PROBE
CANDIDA SPECIES: NEGATIVE
GARDNERELLA VAGINALIS: NEGATIVE
TRICHOMONAS VAG: NEGATIVE

## 2015-07-27 ENCOUNTER — Ambulatory Visit (INDEPENDENT_AMBULATORY_CARE_PROVIDER_SITE_OTHER): Payer: 59 | Admitting: Family Medicine

## 2015-07-27 ENCOUNTER — Encounter: Payer: Self-pay | Admitting: Family Medicine

## 2015-07-27 VITALS — BP 140/80 | HR 100 | Temp 97.9°F | Wt 157.0 lb

## 2015-07-27 DIAGNOSIS — N644 Mastodynia: Secondary | ICD-10-CM | POA: Diagnosis not present

## 2015-07-27 DIAGNOSIS — R5382 Chronic fatigue, unspecified: Secondary | ICD-10-CM

## 2015-07-27 DIAGNOSIS — R5383 Other fatigue: Secondary | ICD-10-CM | POA: Insufficient documentation

## 2015-07-27 NOTE — Progress Notes (Signed)
BP 140/80 mmHg  Pulse 100  Temp(Src) 97.9 F (36.6 C) (Oral)  Wt 157 lb (71.215 kg)   CC: L breast pain Subjective:    Patient ID: Jacqueline Wolf, female    DOB: 04-Apr-1951, 65 y.o.   MRN: [id number]  HPI: Jacqueline Wolf is a 65 y.o. female presenting on 07/27/2015 for Breast Pain; Skin check; and Fatigue   4-5 wks ago fell while walking around neighborhood. Fell onto grass - kink in neck since then. Doesn't remember hitting breast. Around this time started noticing throbbing pain in left breast with radiation to nipple. Intermittent discomfort of left breast with sharp shooting pain into left breast since then. Tried ibuprofen tylenol for this. Denies new masses/lumps, discharge or bleeding from nipple.   Noticing increased fatigue over last few months. Decreased energy. Not daytime somnolence, sleeping well at night time. No change in caffeine intake. Appetite ok. No unexpected weight changes, fevers/chills, night sweats, nausea.   Last mammogram 03/2014 Bir[facility]1.  On estrace  daily.   Relevant past medical, surgical, family and social history reviewed and updated as indicated. Interim medical history since our last visit reviewed. Allergies and medications reviewed and updated. Current Outpatient Prescriptions on File Prior to Visit  Medication Sig  . ALPRAZolam (XANAX) 0.5 MG tablet TAKE 1 TABLET BY MOUTH TWICE DAILY  . Calcium Carb-Cholecalciferol (CALCIUM-VITAMIN D3) 600-500 MG-UNIT CAPS Take 1 capsule by mouth daily.   Marland Kitchen docusate sodium (COLACE) 100 MG capsule Take 100 mg by mouth daily.  Marland Kitchen estradiol (ESTRACE) 2 MG tablet TAKE 1 TABLET (2 MG TOTAL) BY MOUTH DAILY.  . Multiple Vitamin (MULTIVITAMIN) tablet Take 1 tablet by mouth daily.  . Omega-3 Fatty Acids (FISH OIL) 1000 MG CAPS Take 1 capsule (1,000 mg total) by mouth daily.  Marland Kitchen omeprazole (PRILOSEC) 20 MG capsule Take 1 capsule (20 mg total) by mouth daily.  . solifenacin (VESICARE) 5 MG tablet Take 1 tablet (5 mg total) by  mouth daily.  . vitamin E 400 UNIT capsule Take 400 Units by mouth daily.   No current facility-administered medications on file prior to visit.    Review of Systems Per HPI unless specifically indicated in ROS section     Objective:    BP 140/80 mmHg  Pulse 100  Temp(Src) 97.9 F (36.6 C) (Oral)  Wt 157 lb (71.215 kg)  Wt Readings from Last 3 Encounters:  07/27/15 157 lb (71.215 kg)  06/08/15 153 lb 12 oz (69.741 kg)  03/01/15 158 lb 8 oz (71.895 kg)    Physical Exam  Constitutional: She appears well-developed and well-nourished. No distress.  HENT:  Mouth/Throat: Oropharynx is clear and moist. No oropharyngeal exudate.  Eyes: Conjunctivae are normal. Pupils are equal, round, and reactive to light. No scleral icterus.  Neck: Normal range of motion. Neck supple. No thyromegaly present.  Cardiovascular: Normal rate, regular rhythm, normal heart sounds and intact distal pulses.   No murmur heard. Pulmonary/Chest: Effort normal and breath sounds normal. No respiratory distress. She has no wheezes. She has no rales. She exhibits no tenderness. Right breast exhibits no inverted nipple, no mass, no nipple discharge, no skin change and no tenderness. Left breast exhibits no inverted nipple, no mass, no nipple discharge, no skin change and no tenderness.  Musculoskeletal: She exhibits no edema.  Lymphadenopathy:       Head (right side): No submental, no submandibular, no tonsillar, no preauricular and no posterior auricular adenopathy present.       Head (left side): No  submental, no submandibular, no tonsillar, no preauricular and no posterior auricular adenopathy present.    She has no cervical adenopathy.    She has no axillary adenopathy.       Right axillary: No lateral adenopathy present.       Left axillary: No lateral adenopathy present.      Right: No supraclavicular adenopathy present.       Left: No supraclavicular adenopathy present.  Skin: Skin is warm and dry. No rash  noted.  Psychiatric: She has a normal mood and affect.  Nursing note and vitals reviewed.     Assessment & Plan:   Problem List Items Addressed This Visit    Breast pain, left - Primary    Reassuring exam. Due for mammogram. Will order dx mammo/US given breast complaint.       Relevant Orders   MM Digital Diagnostic Bilat   US BREAST COMPLETE UNI LEFT INC AXILLA   US BREAST COMPLETE UNI RIGHT INC AXILLA   Fatigue    Discussed workup. Pt requests labwork today. Ordered to further evaluate for reversible causes of fatigue.      Relevant Orders   Comprehensive metabolic panel   TSH   CBC with Differential/Platelet   Vitamin B12       Follow up plan: Return if symptoms worsen or fail to improve.  Eustaquio BoydenJavier Teagyn Fishel, MD

## 2015-07-27 NOTE — Progress Notes (Signed)
Pre visit review using our clinic review tool, if applicable. No additional management support is needed unless otherwise documented below in the visit note. 

## 2015-07-27 NOTE — Patient Instructions (Signed)
Exam overall ok today. Pass by Marion's office to schedule diagnostic mammogram. Good to see you today, call us with questions.

## 2015-07-27 NOTE — Assessment & Plan Note (Signed)
Discussed workup. Pt requests labwork today. Ordered to further evaluate for reversible causes of fatigue.

## 2015-07-27 NOTE — Assessment & Plan Note (Signed)
Reassuring exam. Due for mammogram. Will order dx mammo/US given breast complaint.

## 2015-07-28 LAB — CBC WITH DIFFERENTIAL/PLATELET
BASOS ABS: 0 10*3/uL (ref 0.0–0.1)
Basophils Relative: 0.3 % (ref 0.0–3.0)
EOS ABS: 0.2 10*3/uL (ref 0.0–0.7)
Eosinophils Relative: 1.5 % (ref 0.0–5.0)
HEMATOCRIT: 36.4 % (ref 36.0–46.0)
HEMOGLOBIN: 12.3 g/dL (ref 12.0–15.0)
LYMPHS PCT: 24.8 % (ref 12.0–46.0)
Lymphs Abs: 2.5 10*3/uL (ref 0.7–4.0)
MCHC: 33.8 g/dL (ref 30.0–36.0)
MCV: 90.2 fl (ref 78.0–100.0)
MONOS PCT: 5.6 % (ref 3.0–12.0)
Monocytes Absolute: 0.6 10*3/uL (ref 0.1–1.0)
Neutro Abs: 6.9 10*3/uL (ref 1.4–7.7)
Neutrophils Relative %: 67.8 % (ref 43.0–77.0)
Platelets: 260 10*3/uL (ref 150.0–400.0)
RBC: 4.03 Mil/uL (ref 3.87–5.11)
RDW: 14.1 % (ref 11.5–15.5)
WBC: 10.2 10*3/uL (ref 4.0–10.5)

## 2015-07-28 LAB — COMPREHENSIVE METABOLIC PANEL
ALK PHOS: 56 U/L (ref 39–117)
ALT: 23 U/L (ref 0–35)
AST: 21 U/L (ref 0–37)
Albumin: 4.4 g/dL (ref 3.5–5.2)
BILIRUBIN TOTAL: 0.4 mg/dL (ref 0.2–1.2)
BUN: 10 mg/dL (ref 6–23)
CALCIUM: 9.9 mg/dL (ref 8.4–10.5)
CO2: 29 mEq/L (ref 19–32)
Chloride: 103 mEq/L (ref 96–112)
Creatinine, Ser: 0.76 mg/dL (ref 0.40–1.20)
GFR: 81.28 mL/min (ref >60.00)
GLUCOSE: 92 mg/dL (ref 70–99)
POTASSIUM: 4.3 meq/L (ref 3.5–5.1)
Sodium: 140 mEq/L (ref 135–145)
TOTAL PROTEIN: 7.2 g/dL (ref 6.0–8.3)

## 2015-07-28 LAB — VITAMIN B12: Vitamin B-12: 261 pg/mL (ref 211–911)

## 2015-07-28 LAB — TSH: TSH: 1.82 u[IU]/mL (ref 0.35–4.50)

## 2015-07-31 ENCOUNTER — Other Ambulatory Visit: Payer: Self-pay | Admitting: Family Medicine

## 2015-07-31 MED ORDER — VITAMIN B-12 1000 MCG PO TABS: 1000.0000 ug | ORAL_TABLET | Freq: Every day | ORAL | Status: AC

## 2015-08-10 ENCOUNTER — Ambulatory Visit
Admission: RE | Admit: 2015-08-10 | Discharge: 2015-08-10 | Disposition: A | Discharge status: Home / Self Care | Payer: 59 | Source: Ambulatory Visit | Attending: Family Medicine | Admitting: Family Medicine

## 2015-08-10 ENCOUNTER — Other Ambulatory Visit: Payer: Self-pay | Admitting: Family Medicine

## 2015-08-10 DIAGNOSIS — N644 Mastodynia: Secondary | ICD-10-CM

## 2015-08-10 DIAGNOSIS — N63 Unspecified lump in breast: Secondary | ICD-10-CM | POA: Insufficient documentation

## 2015-08-13 ENCOUNTER — Encounter: Payer: Self-pay | Admitting: *Deleted

## 2015-08-15 ENCOUNTER — Other Ambulatory Visit: Payer: Self-pay | Admitting: Family Medicine

## 2015-09-30 ENCOUNTER — Other Ambulatory Visit: Payer: Self-pay | Admitting: Family Medicine

## 2015-09-30 NOTE — Telephone Encounter (Signed)
Ok to refill 

## 2015-09-30 NOTE — Telephone Encounter (Signed)
plz phone in. 

## 2015-10-01 NOTE — Telephone Encounter (Signed)
Prescription called to pharmacy.

## 2015-10-18 ENCOUNTER — Ambulatory Visit (INDEPENDENT_AMBULATORY_CARE_PROVIDER_SITE_OTHER): Payer: 59 | Admitting: Family Medicine

## 2015-10-18 ENCOUNTER — Encounter: Payer: Self-pay | Admitting: Family Medicine

## 2015-10-18 VITALS — BP 110/70 | HR 78 | Temp 98.3°F | Wt 154.0 lb

## 2015-10-18 DIAGNOSIS — R21 Rash and other nonspecific skin eruption: Secondary | ICD-10-CM | POA: Diagnosis not present

## 2015-10-18 MED ORDER — TRIAMCINOLONE ACETONIDE 0.1 % EX CREA: 1.0000 "application " | TOPICAL_CREAM | Freq: Two times a day (BID) | CUTANEOUS | Status: AC

## 2015-10-18 MED ORDER — SOLIFENACIN SUCCINATE 5 MG PO TABS: 5.0000 mg | ORAL_TABLET | Freq: Every day | ORAL | Status: AC

## 2015-10-18 MED ORDER — OMEPRAZOLE 20 MG PO CPDR: 20.0000 mg | DELAYED_RELEASE_CAPSULE | Freq: Every day | ORAL | Status: AC

## 2015-10-18 NOTE — Progress Notes (Signed)
BP 110/70 mmHg  Pulse 78  Temp(Src) 98.3 F (36.8 C) (Oral)  Wt 154 lb (69.854 kg)  SpO2 97%   CC: check left upper leg  Subjective:    Patient ID: Jacqueline Wolf, female    DOB: 09-May-1950, 65 y.o.   MRN: [id number]  HPI: Jacqueline MarchKaren Mort is a 65 y.o. female presenting on 10/18/2015 for Nevus and Brittle Nails   Planning on moving back to [place] in 2 months to be near family.   Spot left lateral upper thigh on skin - slightly more pruritic and rough than previously. Present for last 2 months. No new lotions/ detergents, soaps or shampoos. No new meds or foods. No trauma to skin. No significant sun exposure there.  Relevant past medical, surgical, family and social history reviewed and updated as indicated. Interim medical history since our last visit reviewed. Allergies and medications reviewed and updated. Current Outpatient Prescriptions on File Prior to Visit  Medication Sig  . ALPRAZolam (XANAX) 0.5 MG tablet TAKE 1 TABLET BY MOUTH TWICE A DAY  . Calcium Carb-Cholecalciferol (CALCIUM-VITAMIN D3) 600-500 MG-UNIT CAPS Take 1 capsule by mouth daily.   Marland Kitchen. docusate sodium (COLACE) 100 MG capsule Take 100 mg by mouth daily.  Marland Kitchen. estradiol (ESTRACE) 2 MG tablet TAKE 1 TABLET (2 MG TOTAL) BY MOUTH DAILY.  . Multiple Vitamin (MULTIVITAMIN) tablet Take 1 tablet by mouth daily.  . Omega-3 Fatty Acids (FISH OIL) 1000 MG CAPS Take 1 capsule (1,000 mg total) by mouth daily.  . vitamin E 400 UNIT capsule Take 400 Units by mouth daily.  . vitamin B-12 (CYANOCOBALAMIN) 1000 MCG tablet Take 1 tablet (1,000 mcg total) by mouth daily. (Patient not taking: Reported on 10/18/2015)   No current facility-administered medications on file prior to visit.   Past Medical History  Diagnosis Date  . Panic attacks 1980s    rare use of xanax  . Interstitial cystitis     did not tolerate side effects of elmicon (urology)  . Arthritis   . Osteopenia     cal/vit D daily  . Elevated blood pressure reading  without diagnosis of hypertension   . HLD (hyperlipidemia)   . Kidney stones 01/2013?  Marland Kitchen. Urine incontinence   . Hx: UTI (urinary tract infection)   . History of hepatitis B 1968  . Eosinophilic esophagitis 2013    on PPI - beef allergy  . Prolapse of female pelvic organs     with rectocele and vaginal atrophy, has seen urogyn in past  . IBS (irritable bowel syndrome)   . Esophageal reflux   . DDD (degenerative disc disease), cervical 2010, 2016    with mild spinal stenosis C3-7 by MRI  . Lumbar radiculopathy 2013    R L4/5 by MRI  . Diverticulosis     by CT and colonoscopy    Past Surgical History  Procedure Laterality Date  . Breast biopsy Bilateral [phone]    all benign  . Tonsillectomy  child  . Abdominal hysterectomy  1994    ovaries removed  . Rectocele repair  2010  . Anterior and posterior vaginal repair  2010    with cystocele repair, mesh  . Knee arthroscopy Right 1994  . Bilateral oophorectomy Bilateral 1996    ovaries removed - cysts  . Esophagogastroduodenoscopy  2013    eosinophilic esophagitis  . Colonoscopy  03/2012    extensive diverticulosis, int hem, rpt 10 yrs [facility]  . Cardiovascular stress test  09/2009  WNL EF 65%  . Dexa  2009    WNL, mild osteopenia T -1.0    Family History  Problem Relation Age of Onset  . Diabetes Maternal Grandmother     and great aunt  . Diabetes Maternal Grandfather   . CAD Maternal Grandfather     MI  . Cancer Brother 60    liver (EtOH)  . Cancer Mother     bladder  . Stroke Neg Hx   . Breast cancer Neg Hx   . Alcohol abuse Brother     and sisters     Social History  Substance Use Topics  . Smoking status: Former Smoker    Quit date: 02/23/2008  . Smokeless tobacco: Never Used  . Alcohol Use: Yes     Comment: occasional alcohol    Review of Systems Per HPI unless specifically indicated in ROS section     Objective:    BP 110/70 mmHg  Pulse 78  Temp(Src) 98.3 F (36.8 C)  (Oral)  Wt 154 lb (69.854 kg)  SpO2 97%  Wt Readings from Last 3 Encounters:  10/18/15 154 lb (69.854 kg)  07/27/15 157 lb (71.215 kg)  06/08/15 153 lb 12 oz (69.741 kg)    Physical Exam  Constitutional: She appears well-developed and well-nourished. No distress.  Skin: Skin is warm and dry. Rash noted.  Small approx 1.5cm rough hyperpigmented macule left lateral upper thigh with some scaling present, not pruritic or erythematous  Psychiatric: She has a normal mood and affect.  Nursing note and vitals reviewed.  Results for orders placed or performed in visit on 07/27/15  Comprehensive metabolic panel  Result Value Ref Range   Sodium 140 135 - 145 mEq/L   Potassium 4.3 3.5 - 5.1 mEq/L   Chloride 103 96 - 112 mEq/L   CO2 29 19 - 32 mEq/L   Glucose, Bld 92 70 - 99 mg/dL   BUN 10 6 - 23 mg/dL   Creatinine, Ser 4.090.76 0.40 - 1.20 mg/dL   Total Bilirubin 0.4 0.2 - 1.2 mg/dL   Alkaline Phosphatase 56 39 - 117 U/L   AST 21 0 - 37 U/L   ALT 23 0 - 35 U/L   Total Protein 7.2 6.0 - 8.3 g/dL   Albumin 4.4 3.5 - 5.2 g/dL   Calcium 9.9 8.4 - 81.110.5 mg/dL   GFR 91.4781.28 >82.95>60.00 mL/min  TSH  Result Value Ref Range   TSH 1.82 0.35 - 4.50 uIU/mL  CBC with Differential/Platelet  Result Value Ref Range   WBC 10.2 4.0 - 10.5 K/uL   RBC 4.03 3.87 - 5.11 Mil/uL   Hemoglobin 12.3 12.0 - 15.0 g/dL   HCT 62.136.4 30.836.0 - 65.746.0 %   MCV 90.2 78.0 - 100.0 fl   MCHC 33.8 30.0 - 36.0 g/dL   RDW 84.614.1 96.211.5 - 95.215.5 %   Platelets 260.0 150.0 - 400.0 K/uL   Neutrophils Relative % 67.8 43.0 - 77.0 %   Lymphocytes Relative 24.8 12.0 - 46.0 %   Monocytes Relative 5.6 3.0 - 12.0 %   Eosinophils Relative 1.5 0.0 - 5.0 %   Basophils Relative 0.3 0.0 - 3.0 %   Neutro Abs 6.9 1.4 - 7.7 K/uL   Lymphs Abs 2.5 0.7 - 4.0 K/uL   Monocytes Absolute 0.6 0.1 - 1.0 K/uL   Eosinophils Absolute 0.2 0.0 - 0.7 K/uL   Basophils Absolute 0.0 0.0 - 0.1 K/uL  Vitamin B12  Result Value Ref Range   Vitamin  B-12 261 211 - 911 pg/mL       Assessment & Plan:  Advised let us know who to send records to when she establishes with new PCP in [place]. 3 mo rx for vesicare and omeprazole with RF #1 printed today. Problem List Items Addressed This Visit    Rash and nonspecific skin eruption - Primary    ?very small ringworm - rec start OTC clotrimazole cream BID x 2 wks, if no improvement, may trial triamcinolone cream BID AA x 2 wks (Rx printed today) to treat possible eczematous rash.  Overall reassuring exam.           Follow up plan: Return if symptoms worsen or fail to improve.  Eustaquio Boyden, MD

## 2015-10-18 NOTE — Patient Instructions (Addendum)
Try clotrimazole cream (over counter) twice daily for 2 weeks.  If no improvement, may try triamcinolone cream twice daily for 2 weeks.  Take daily biotin vitamin for nail health.

## 2015-10-18 NOTE — Assessment & Plan Note (Signed)
?  very small ringworm - rec start OTC clotrimazole cream BID x 2 wks, if no improvement, may trial triamcinolone cream BID AA x 2 wks (Rx printed today) to treat possible eczematous rash.  Overall reassuring exam.

## 2015-10-18 NOTE — Progress Notes (Signed)
Pre visit review using our clinic review tool, if applicable. No additional management support is needed unless otherwise documented below in the visit note. 

## 2015-11-09 ENCOUNTER — Other Ambulatory Visit: Payer: Self-pay | Admitting: Family Medicine

## 2016-01-01 IMAGING — CR DG CHEST 1V
1 series · 1 of 1 positions shown · non-contrast
Comparison: None.

CLINICAL DATA: Right chest wall and right shoulder pain for 1 year
and a half

EXAM:
CHEST  1 VIEW

[dxr chest 1 viewap or pa]
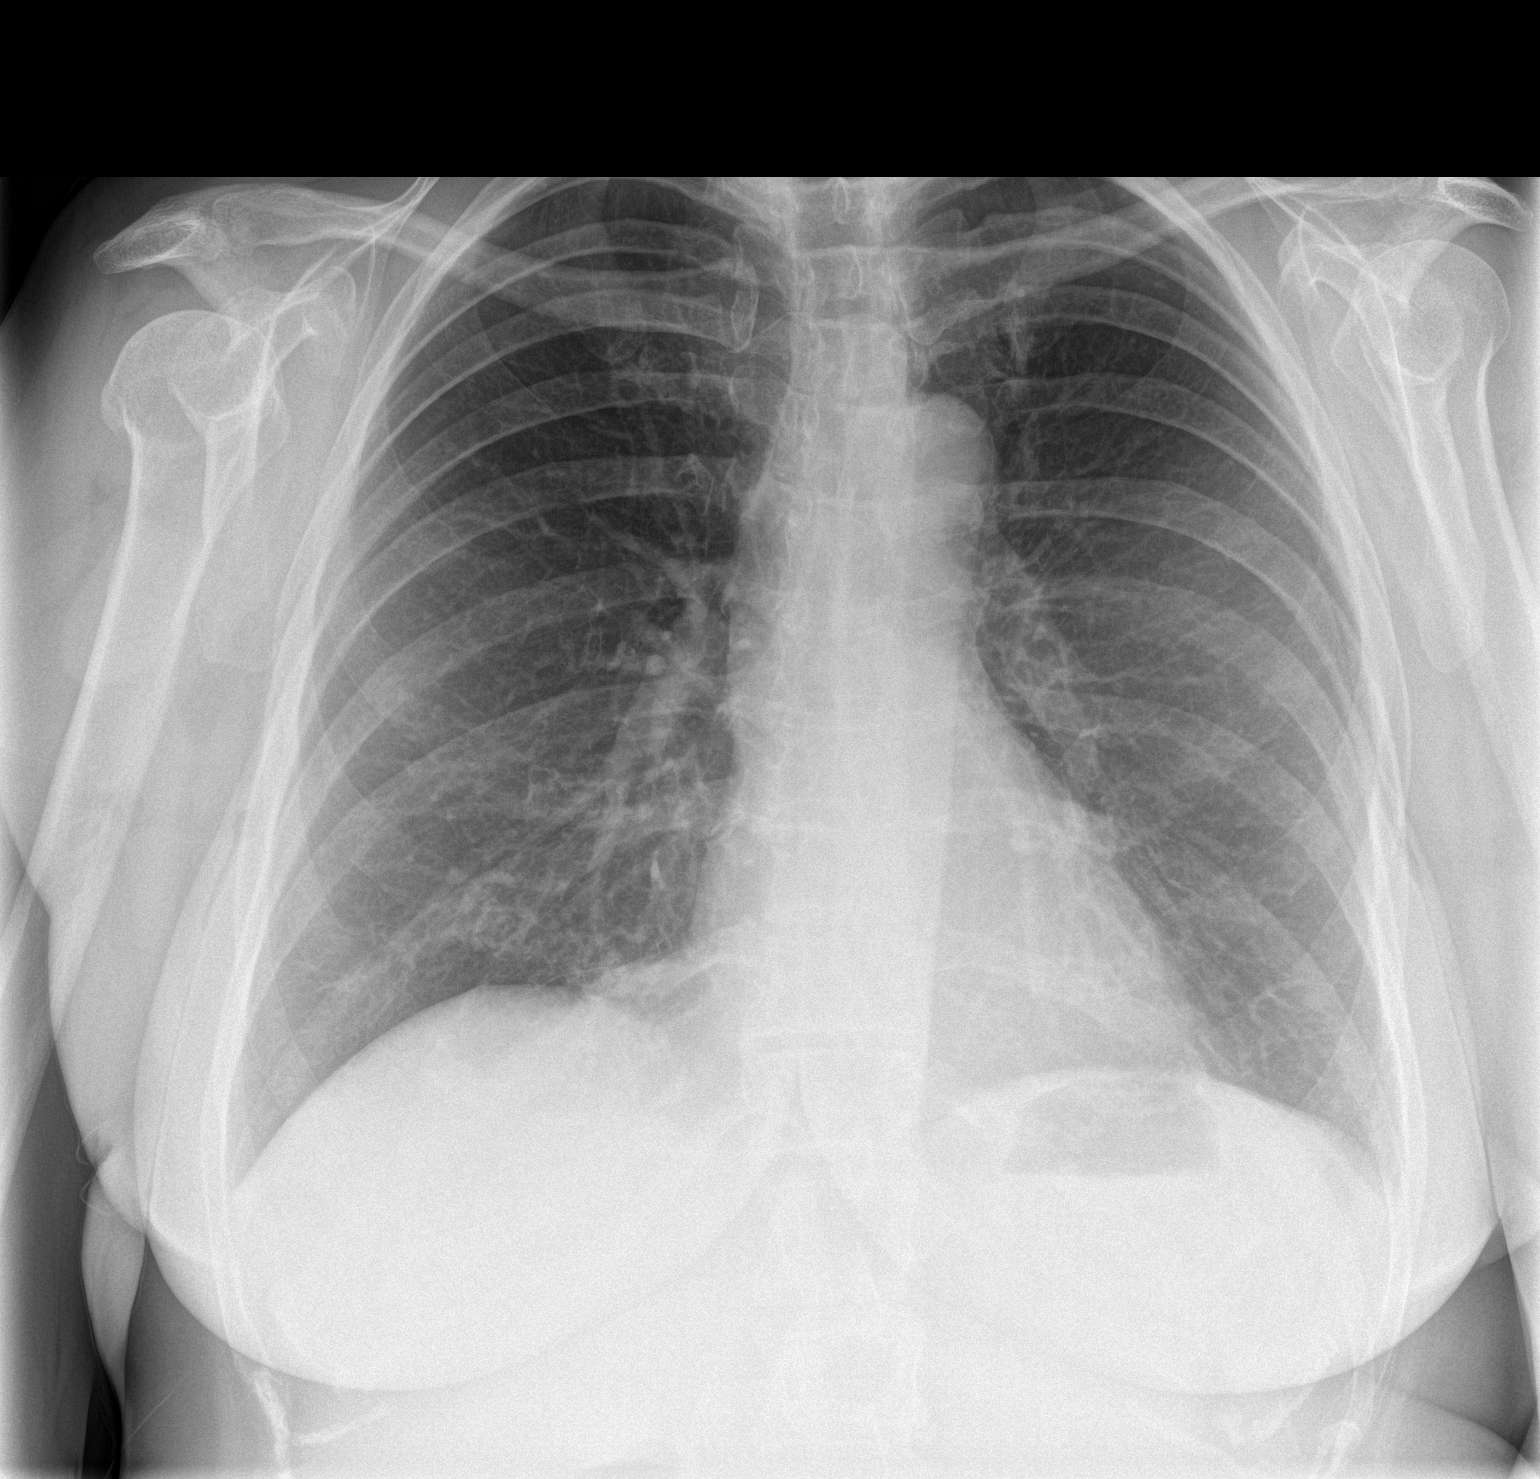

[1 of 1 positions shown; findings below may reference images not displayed]

FINDINGS: Cardiomediastinal silhouette is unremarkable. No acute infiltrate or
pleural effusion. No pulmonary edema. No destructive bony lesions
are identified.
IMPRESSION: No active disease.

## 2016-05-20 ENCOUNTER — Other Ambulatory Visit: Payer: Self-pay | Admitting: Family Medicine

## 2016-11-03 IMAGING — US US BREAST*L* LIMITED INC AXILLA
1 series · 7 of 7 positions shown · non-contrast
Comparison: Previous exam(s).

CLINICAL DATA: Three episodes of stabbing pain extending from the
posterior central left breast to the nipple. The patient recently
fell, with a neck injury.

EXAM:
2D DIGITAL DIAGNOSTIC BILATERAL MAMMOGRAM WITH CAD AND ADJUNCT TOMO
ULTRASOUND LEFT BREAST

[Series 1: us breast*left* limited inc axilla · 0.06mm/px · 7 of 7 slices shown]
[im 1/7]
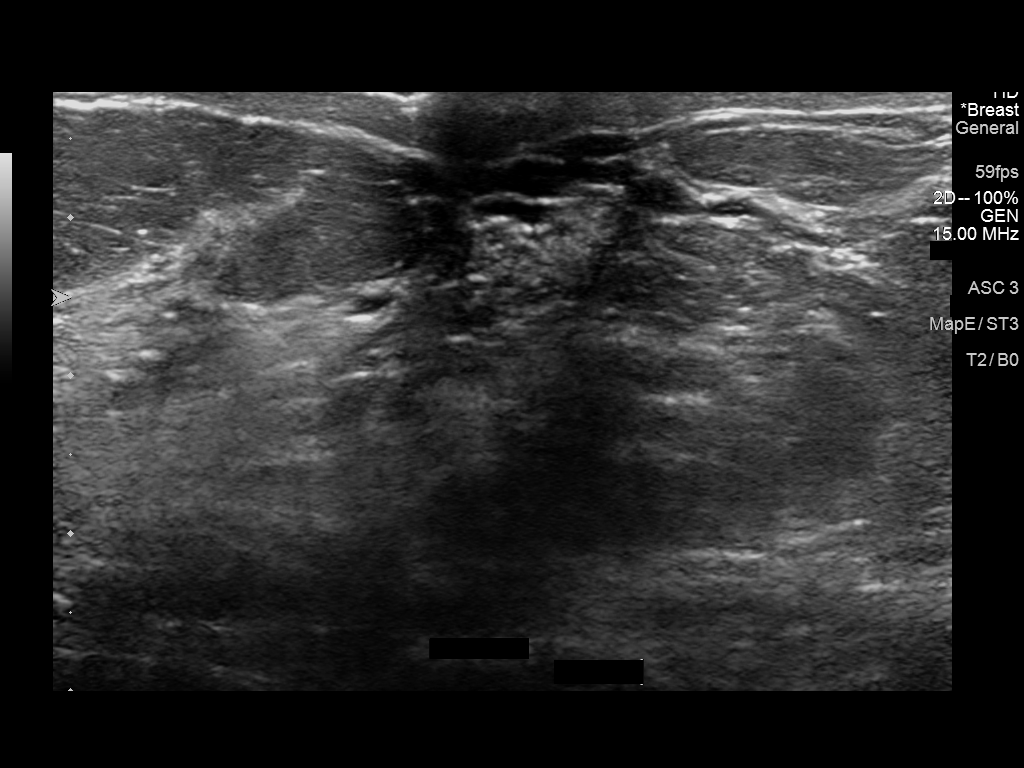
[im 2/7]
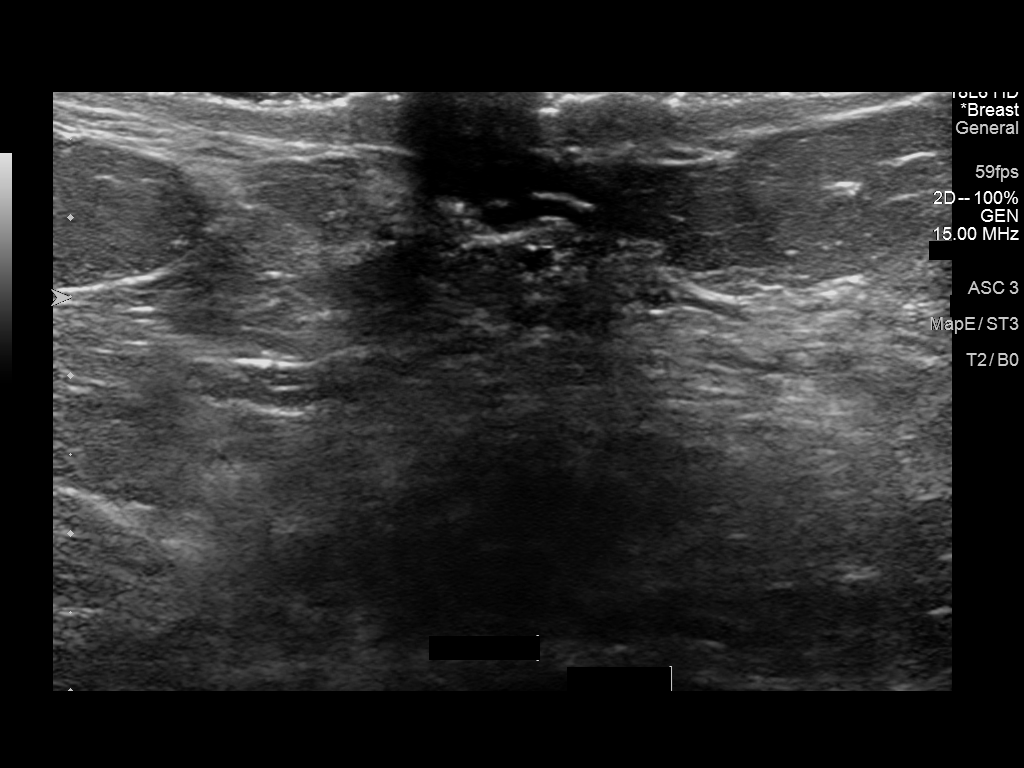
[im 3/7]
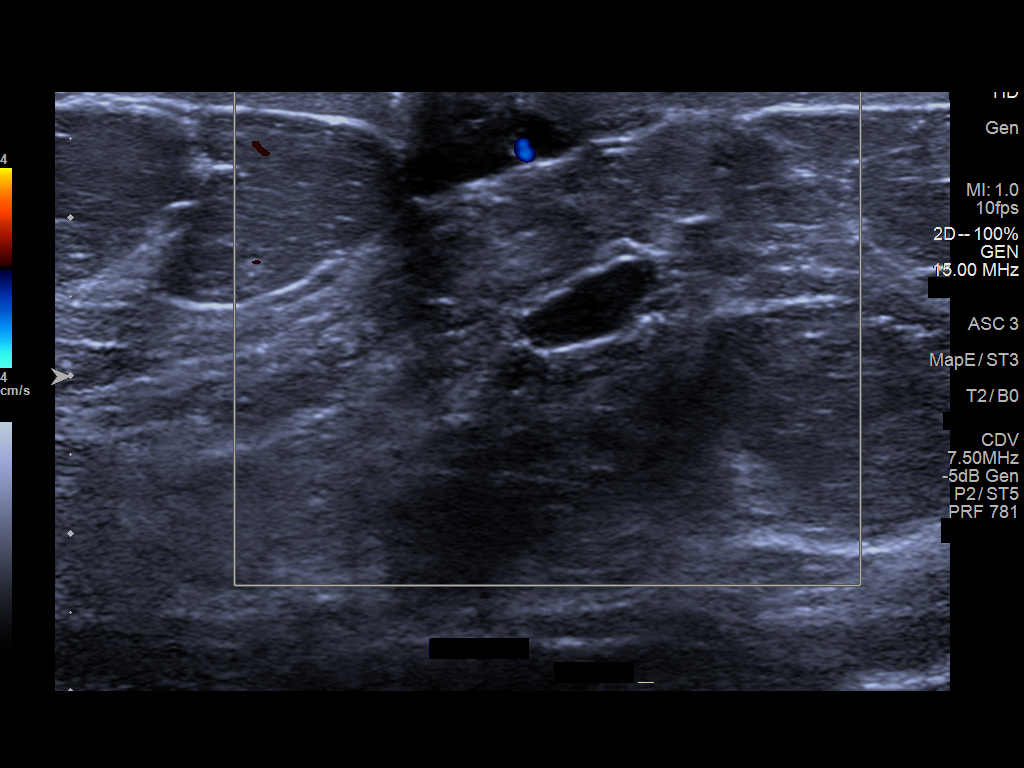
[im 4/7]
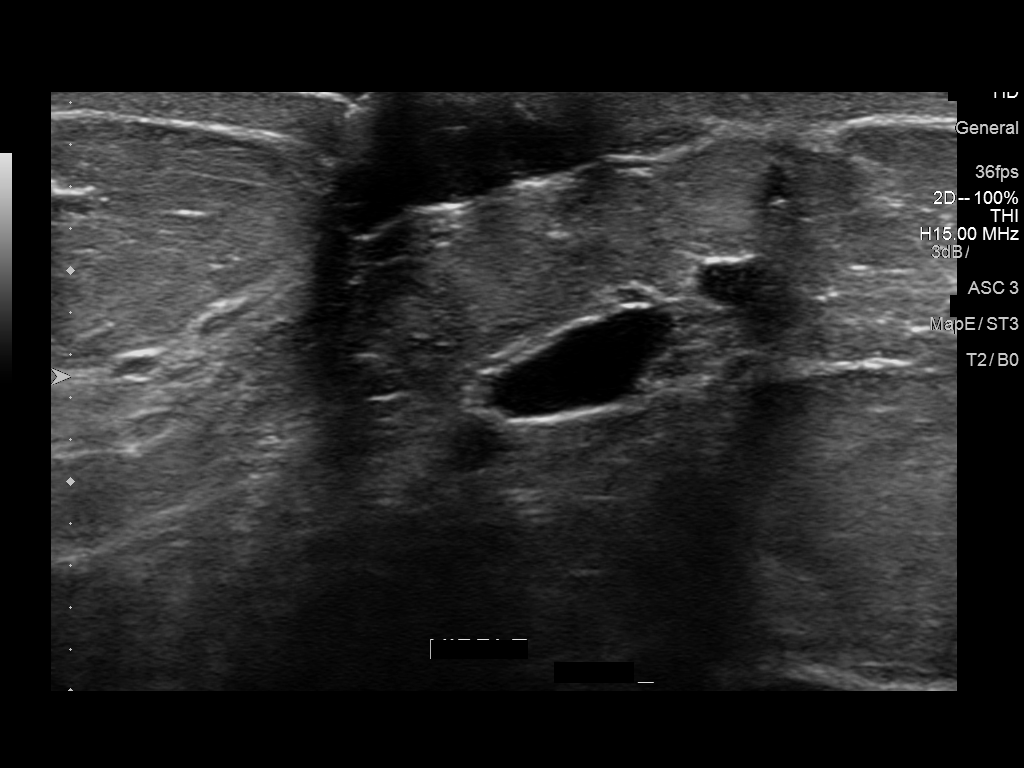
[im 5/7]
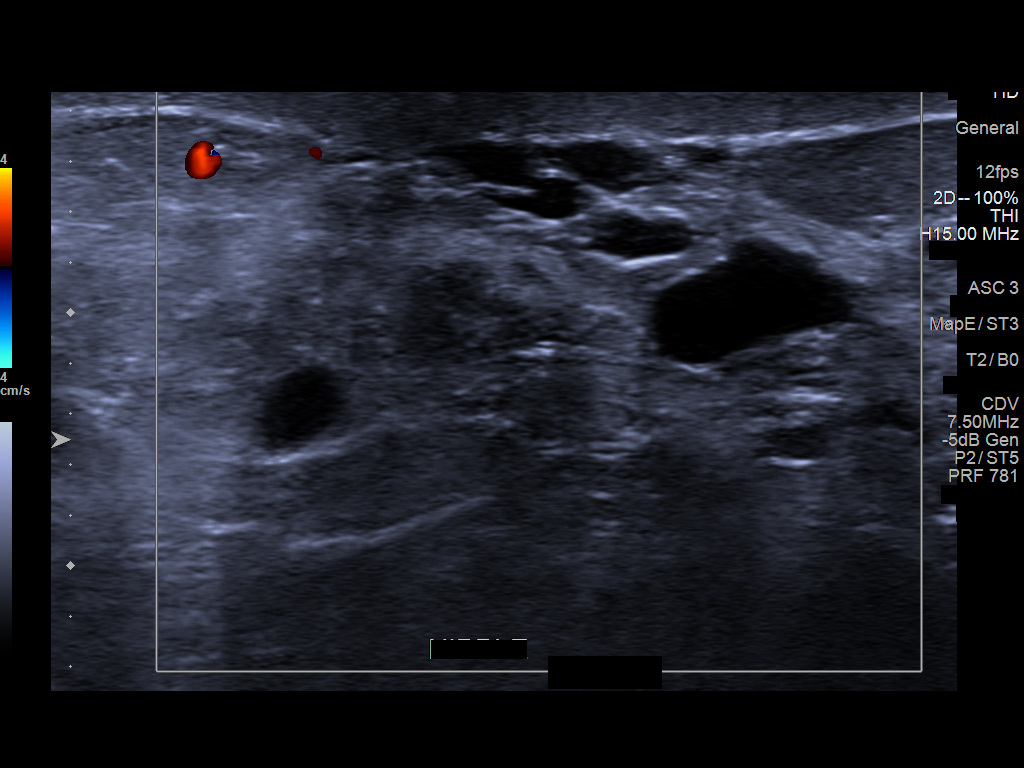
[im 6/7]
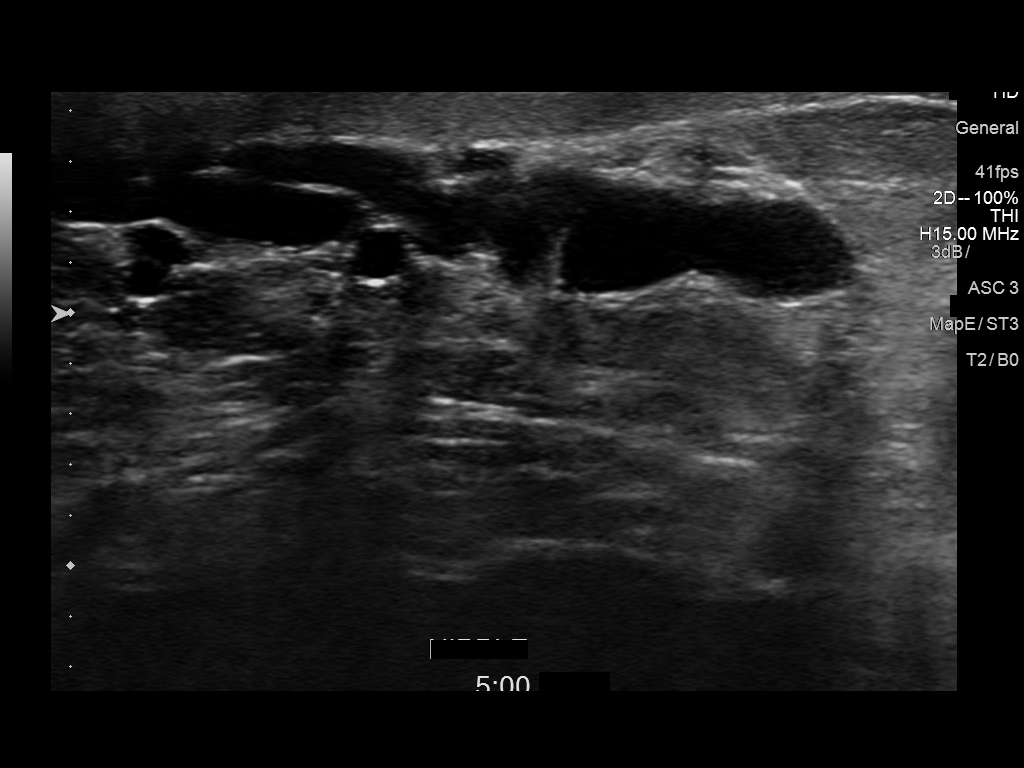
[im 7/7]
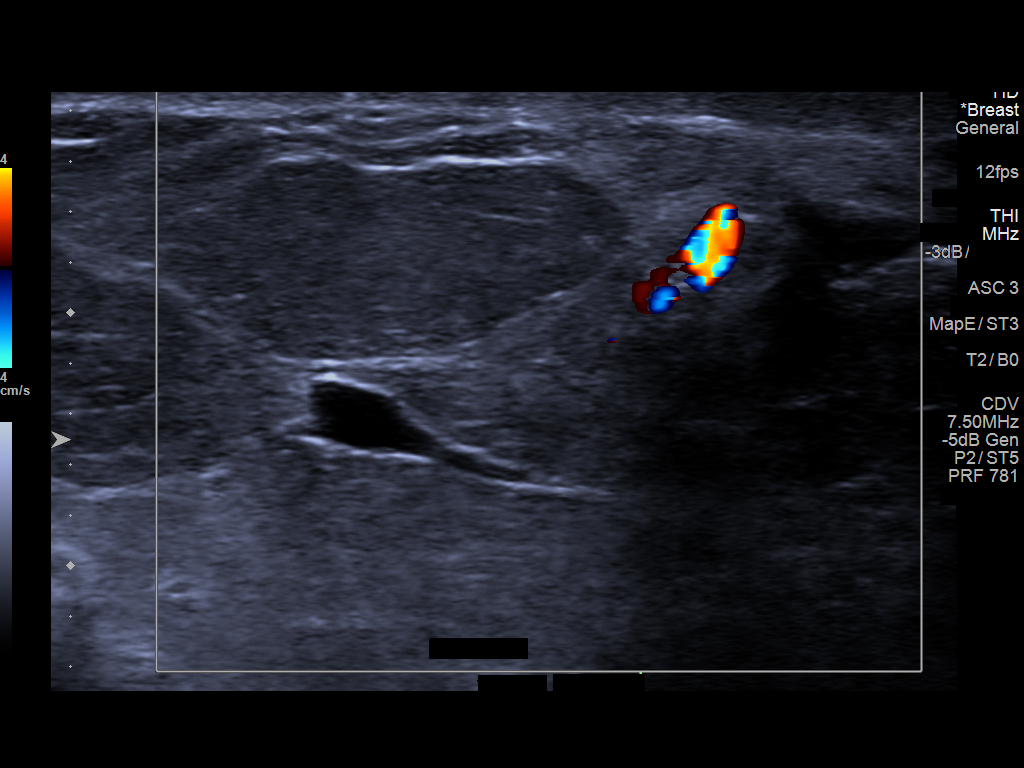

[7 of 7 positions shown; findings below may reference images not displayed]

ACR Breast Density Category b: There are scattered areas of
fibroglandular density.
FINDINGS: Multiple rounded and oval, circumscribed masses in both breasts have
not changed significantly since 01/13/2013, compatible with a benign
process. No interval findings suspicious for malignancy in either
breast.

Mammographic images were processed with CAD.

On physical exam, the patient is mildly tender in the retroareolar
and periareolar regions of the left breast with no palpable mass.

Targeted ultrasound is performed, showing multiple dilated
retroareolar ducts with multiple areas of focal cystic dilatation.
No mass or other findings suspicious for malignancy seen.
IMPRESSION: Left breast duct ectasia.  No evidence of malignancy.

RECOMMENDATION:
Bilateral screening mammogram in 1 year.

I have discussed the findings and recommendations with the patient.
Results were also provided in writing at the conclusion of the
visit. If applicable, a reminder letter will be sent to the patient
regarding the next appointment.

BI-RADS CATEGORY  2: Benign.
